# Patient Record
Sex: Male | Born: 1990 | Race: White | Hispanic: No | Marital: Single | Smoking: Current every day smoker
Health system: Southern US, Community
[De-identification: ages and names within clinical notes are randomized; demographics above are authoritative.]

## PROBLEM LIST (undated history)

## (undated) DIAGNOSIS — Z8781 Personal history of (healed) traumatic fracture: Secondary | ICD-10-CM

---

## 2011-09-27 DIAGNOSIS — Z8781 Personal history of (healed) traumatic fracture: Secondary | ICD-10-CM

## 2011-09-27 HISTORY — DX: Personal history of (healed) traumatic fracture: Z87.81

## 2011-12-31 ENCOUNTER — Emergency Department (HOSPITAL_COMMUNITY)
Admission: EM | Admit: 2011-12-31 | Discharge: 2011-12-31 | Disposition: A | Discharge status: Home / Self Care | Payer: Self-pay | Attending: Emergency Medicine | Admitting: Emergency Medicine

## 2011-12-31 ENCOUNTER — Emergency Department (HOSPITAL_COMMUNITY): Payer: Self-pay

## 2011-12-31 ENCOUNTER — Encounter (HOSPITAL_COMMUNITY): Payer: Self-pay

## 2011-12-31 DIAGNOSIS — S99921A Unspecified injury of right foot, initial encounter: Secondary | ICD-10-CM

## 2011-12-31 DIAGNOSIS — Z7982 Long term (current) use of aspirin: Secondary | ICD-10-CM | POA: Insufficient documentation

## 2011-12-31 DIAGNOSIS — W219XXA Striking against or struck by unspecified sports equipment, initial encounter: Secondary | ICD-10-CM | POA: Insufficient documentation

## 2011-12-31 DIAGNOSIS — S99929A Unspecified injury of unspecified foot, initial encounter: Secondary | ICD-10-CM | POA: Insufficient documentation

## 2011-12-31 DIAGNOSIS — S8990XA Unspecified injury of unspecified lower leg, initial encounter: Secondary | ICD-10-CM | POA: Insufficient documentation

## 2011-12-31 DIAGNOSIS — Y998 Other external cause status: Secondary | ICD-10-CM | POA: Insufficient documentation

## 2011-12-31 DIAGNOSIS — Y9361 Activity, american tackle football: Secondary | ICD-10-CM | POA: Insufficient documentation

## 2011-12-31 DIAGNOSIS — F17200 Nicotine dependence, unspecified, uncomplicated: Secondary | ICD-10-CM | POA: Insufficient documentation

## 2011-12-31 MED ORDER — OXYCODONE-ACETAMINOPHEN 5-325 MG PO TABS: 1.0000 | ORAL_TABLET | Freq: Four times a day (QID) | ORAL | Status: AC | PRN

## 2011-12-31 NOTE — ED Notes (Signed)
Pt in from home with pain and swelling to the right foot states onset yesterday while playing football states numbness and tingling to the toes capillary refill good pulse present states difficulty walking

## 2011-12-31 NOTE — ED Provider Notes (Signed)
History     CSN: [id number]  Arrival date & time 12/31/11  1136   First MD Initiated Contact with Patient 12/31/11 1246      Chief Complaint  Patient presents with  . Foot Pain    (Consider location/radiation/quality/duration/timing/severity/associated sxs/prior treatment) HPI  21 year old male presents with right foot injury. Patient states pain football yesterday when another player accidentally stomp on his right foot. Patient experiencing acute onset of pain and he may have heard a pop. Pain was significant enough that he is unable to bear any weight afterward. He noticed swelling and tingling sensation to his foot. He denies any ankle pain. He denies any other injury. He denies hitting his head or loss of consciousness.  History reviewed. No pertinent past medical history.  History reviewed. No pertinent past surgical history.  No family history on file.  History  Substance Use Topics  . Smoking status: Current Some Day Smoker  . Smokeless tobacco: Not on file  . Alcohol Use: No      Review of Systems  Constitutional: Negative.   HENT: Negative for neck pain.   Cardiovascular: Negative for chest pain.  Musculoskeletal: Negative for back pain.    Allergies  Review of patient's allergies indicates no known allergies.  Home Medications   Current Outpatient Rx  Name Route Sig Dispense Refill  . ASPIRIN 325 MG PO TABS Oral Take 650 mg by mouth every 6 (six) hours as needed. For pain.      BP 139/85  Pulse 80  Temp(Src) 98.1 F (36.7 C) (Oral)  Resp 20  SpO2 100%  Physical Exam  Nursing note and vitals reviewed. Constitutional: He appears well-developed and well-nourished. No distress.  HENT:  Head: Atraumatic.  Eyes: Conjunctivae are normal.  Neck: Neck supple.  Musculoskeletal: He exhibits edema and tenderness.       R foot: edema and tenderness noted to dorsum and sole of R foot distally.  Palpable pedal pulse, brisk cap refills, sensation intact.   No break in skin, no rash.  Ecchymosis noted  R ankle with FROM, nontender.  Pt unable to bear weight on R foot.    Neurological: He is alert.  Skin: Skin is warm.  Psychiatric: He has a normal mood and affect.    ED Course  Procedures (including critical care time)  Labs Reviewed - No data to display Dg Foot Complete Right  12/31/2011  *RADIOLOGY REPORT*  Clinical Data: Foot pain post injury yesterday  RIGHT FOOT COMPLETE - 3+ VIEW  Comparison: None.  Findings: Three views of the right foot submitted.  No acute fracture or subluxation.  No radiopaque foreign body.  IMPRESSION: No acute fracture or subluxation.  Original Report Authenticated By: Natasha Mead, M.D.     No diagnosis found.    MDM  R foot injury, xray negative for fx.  Will provide post-op shoe, crutches, pain medication and referral to ortho.  Pt voice understanding.          Fayrene Helper, PA-C 12/31/11 1300

## 2011-12-31 NOTE — Discharge Instructions (Signed)
You have injured your right foot.  No evidence of fracture or dislocation on xray.  Wear post-op shoe, use crutches, and take pain medication as prescribed.  Do not drive or operate heavy machinery while taking pain medication.  Follow up with orthopedic for further management.    RICE: Routine Care for Injuries The routine care of many injuries includes Rest, Ice, Compression, and Elevation (RICE). HOME CARE INSTRUCTIONS  Rest is needed to allow your body to heal. Routine activities can usually be resumed when comfortable. Injured tendons and bones can take up to 6 weeks to heal. Tendons are the cord-like structures that attach muscle to bone.   Ice following an injury helps keep the swelling down and reduces pain.   Put ice in a plastic bag.   Place a towel between your skin and the bag.   Leave the ice on for 15 to 20 minutes, 3 to 4 times a day. Do this while awake, for the first 24 to 48 hours. After that, continue as directed by your caregiver.   Compression helps keep swelling down. It also gives support and helps with discomfort. If an elastic bandage has been applied, it should be removed and reapplied every 3 to 4 hours. It should not be applied tightly, but firmly enough to keep swelling down. Watch fingers or toes for swelling, bluish discoloration, coldness, numbness, or excessive pain. If any of these problems occur, remove the bandage and reapply loosely. Contact your caregiver if these problems continue.   Elevation helps reduce swelling and decreases pain. With extremities, such as the arms, hands, legs, and feet, the injured area should be placed near or above the level of the heart, if possible.  SEEK IMMEDIATE MEDICAL CARE IF:  You have persistent pain and swelling.   You develop redness, numbness, or unexpected weakness.   Your symptoms are getting worse rather than improving after several days.  These symptoms may indicate that further evaluation or further X-rays are  needed. Sometimes, X-rays may not show a small broken bone (fracture) until 1 week or 10 days later. Make a follow-up appointment with your caregiver. Ask when your X-ray results will be ready. Make sure you get your X-ray results. Document Released: 12/25/2000 Document Revised: 09/01/2011 Document Reviewed: 02/11/2011 [facility] Patient Information 2012 [name], [name].

## 2012-01-01 NOTE — ED Provider Notes (Signed)
Medical screening examination/treatment/procedure(s) were performed by non-physician practitioner and as supervising physician I was immediately available for consultation/collaboration.   Cami Delawder Y. Jovon Streetman, MD 01/01/12 0726 

## 2012-02-16 ENCOUNTER — Encounter (HOSPITAL_COMMUNITY): Payer: Self-pay | Admitting: Certified Registered Nurse Anesthetist

## 2012-02-16 ENCOUNTER — Emergency Department (HOSPITAL_COMMUNITY): Payer: Self-pay

## 2012-02-16 ENCOUNTER — Encounter (HOSPITAL_COMMUNITY)
Admission: EM | Disposition: A | Discharge status: Home / Self Care | Payer: Self-pay | Source: Home / Self Care | Attending: Emergency Medicine

## 2012-02-16 ENCOUNTER — Encounter (HOSPITAL_COMMUNITY): Payer: Self-pay | Admitting: Emergency Medicine

## 2012-02-16 ENCOUNTER — Emergency Department (HOSPITAL_COMMUNITY)
Admission: EM | Admit: 2012-02-16 | Discharge: 2012-02-17 | Disposition: A | Discharge status: Home / Self Care | Payer: Self-pay | Attending: Emergency Medicine | Admitting: Emergency Medicine

## 2012-02-16 ENCOUNTER — Emergency Department (HOSPITAL_COMMUNITY): Payer: Self-pay | Admitting: Certified Registered Nurse Anesthetist

## 2012-02-16 DIAGNOSIS — IMO0002 Reserved for concepts with insufficient information to code with codable children: Secondary | ICD-10-CM | POA: Insufficient documentation

## 2012-02-16 DIAGNOSIS — S63004A Unspecified dislocation of right wrist and hand, initial encounter: Secondary | ICD-10-CM

## 2012-02-16 DIAGNOSIS — S62143A Displaced fracture of body of hamate [unciform] bone, unspecified wrist, initial encounter for closed fracture: Secondary | ICD-10-CM | POA: Insufficient documentation

## 2012-02-16 DIAGNOSIS — S62141A Displaced fracture of body of hamate [unciform] bone, right wrist, initial encounter for closed fracture: Secondary | ICD-10-CM

## 2012-02-16 HISTORY — PX: FASCIOTOMY: SHX132

## 2012-02-16 LAB — POCT I-STAT, CHEM 8
BUN: 4 mg/dL — ABNORMAL LOW (ref 6–23)
Calcium, Ion: 1.1 mmol/L — ABNORMAL LOW (ref 1.12–1.32)
Creatinine, Ser: 1 mg/dL (ref 0.50–1.35)
Hemoglobin: 18.4 g/dL — ABNORMAL HIGH (ref 13.0–17.0)
Sodium: 141 mEq/L (ref 135–145)
TCO2: 24 mmol/L (ref 0–100)

## 2012-02-16 LAB — DIFFERENTIAL
Basophils Absolute: 0 10*3/uL (ref 0.0–0.1)
Eosinophils Absolute: 0.1 10*3/uL (ref 0.0–0.7)
Eosinophils Relative: 0 % (ref 0–5)
Monocytes Absolute: 1.1 10*3/uL — ABNORMAL HIGH (ref 0.1–1.0)

## 2012-02-16 LAB — CBC
HCT: 50.9 % (ref 39.0–52.0)
MCH: 31.7 pg (ref 26.0–34.0)
MCV: 89.6 fL (ref 78.0–100.0)
Platelets: 260 10*3/uL (ref 150–400)
RDW: 13.1 % (ref 11.5–15.5)

## 2012-02-16 SURGERY — CLOSED REDUCTION, FINGER, WITH PERCUTANEOUS PINNING
Anesthesia: General | Site: Hand | Laterality: Right | Wound class: Contaminated

## 2012-02-16 MED ORDER — LIDOCAINE HCL (CARDIAC) 20 MG/ML IV SOLN
INTRAVENOUS | Status: DC | PRN
  Administered 2012-02-16: 100 mg via INTRAVENOUS

## 2012-02-16 MED ORDER — 0.9 % SODIUM CHLORIDE (POUR BTL) OPTIME
TOPICAL | Status: DC | PRN
  Administered 2012-02-16 – 2012-02-17 (×2): 500 mL

## 2012-02-16 MED ORDER — PROPOFOL 10 MG/ML IV EMUL
INTRAVENOUS | Status: DC | PRN
  Administered 2012-02-16: 200 mg via INTRAVENOUS

## 2012-02-16 MED ORDER — MIDAZOLAM HCL 5 MG/5ML IJ SOLN
INTRAMUSCULAR | Status: DC | PRN
  Administered 2012-02-16: 2 mg via INTRAVENOUS

## 2012-02-16 MED ORDER — BUPIVACAINE HCL (PF) 0.5 % IJ SOLN
INTRAMUSCULAR | Status: AC
  Filled 2012-02-16: qty 30

## 2012-02-16 MED ORDER — SUCCINYLCHOLINE CHLORIDE 20 MG/ML IJ SOLN
INTRAMUSCULAR | Status: DC | PRN
  Administered 2012-02-16: 100 mg via INTRAVENOUS

## 2012-02-16 MED ORDER — CHLORHEXIDINE GLUCONATE 4 % EX LIQD: 60.0000 mL | Freq: Once | CUTANEOUS | Status: AC

## 2012-02-16 MED ORDER — LACTATED RINGERS IV SOLN
INTRAVENOUS | Status: DC | PRN
  Administered 2012-02-16: 22:00:00 via INTRAVENOUS

## 2012-02-16 MED ORDER — ROCURONIUM BROMIDE 100 MG/10ML IV SOLN
INTRAVENOUS | Status: DC | PRN
  Administered 2012-02-16: 40 mg via INTRAVENOUS

## 2012-02-16 MED ORDER — DEXAMETHASONE SODIUM PHOSPHATE 10 MG/ML IJ SOLN
INTRAMUSCULAR | Status: DC | PRN
  Administered 2012-02-16: 10 mg via INTRAVENOUS

## 2012-02-16 MED ORDER — HYDROCODONE-ACETAMINOPHEN 5-325 MG PO TABS
1.0000 | ORAL_TABLET | Freq: Once | ORAL | Status: AC
  Administered 2012-02-16: 1 via ORAL
  Filled 2012-02-16: qty 1

## 2012-02-16 MED ORDER — TETANUS-DIPHTH-ACELL PERTUSSIS 5-2.5-18.5 LF-MCG/0.5 IM SUSP
0.5000 mL | Freq: Once | INTRAMUSCULAR | Status: AC
  Administered 2012-02-16: 0.5 mL via INTRAMUSCULAR
  Filled 2012-02-16: qty 0.5

## 2012-02-16 MED ORDER — ACETAMINOPHEN 10 MG/ML IV SOLN
INTRAVENOUS | Status: DC | PRN
  Administered 2012-02-16: 1000 mg via INTRAVENOUS

## 2012-02-16 MED ORDER — CEFAZOLIN SODIUM 1-5 GM-% IV SOLN
INTRAVENOUS | Status: AC
  Filled 2012-02-16: qty 50

## 2012-02-16 MED ORDER — NICOTINE 21 MG/24HR TD PT24
21.0000 mg | MEDICATED_PATCH | Freq: Once | TRANSDERMAL | Status: DC
  Administered 2012-02-16: 21 mg via TRANSDERMAL
  Filled 2012-02-16: qty 1

## 2012-02-16 MED ORDER — ACETAMINOPHEN 10 MG/ML IV SOLN
INTRAVENOUS | Status: AC
  Filled 2012-02-16: qty 100

## 2012-02-16 MED ORDER — CEFAZOLIN SODIUM 1-5 GM-% IV SOLN
1.0000 g | INTRAVENOUS | Status: AC
  Administered 2012-02-16 (×2): 1 g via INTRAVENOUS
  Filled 2012-02-16: qty 50

## 2012-02-16 MED ORDER — FENTANYL CITRATE 0.05 MG/ML IJ SOLN
INTRAMUSCULAR | Status: DC | PRN
  Administered 2012-02-16 – 2012-02-17 (×5): 50 ug via INTRAVENOUS

## 2012-02-16 SURGICAL SUPPLY — 41 items
BAG ZIPLOCK 12X15 (MISCELLANEOUS) ×3 IMPLANT
BANDAGE ELASTIC 3 VELCRO ST LF (GAUZE/BANDAGES/DRESSINGS) ×6 IMPLANT
BANDAGE ELASTIC 4 VELCRO ST LF (GAUZE/BANDAGES/DRESSINGS) IMPLANT
BANDAGE GAUZE ELAST BULKY 4 IN (GAUZE/BANDAGES/DRESSINGS) ×6 IMPLANT
BNDG COHESIVE 4X5 TAN STRL (GAUZE/BANDAGES/DRESSINGS) IMPLANT
CANISTER SUCTION 2500CC (MISCELLANEOUS) IMPLANT
CLEANER TIP ELECTROSURG 2X2 (MISCELLANEOUS) IMPLANT
CLOTH BEACON ORANGE TIMEOUT ST (SAFETY) ×3 IMPLANT
CORDS BIPOLAR (ELECTRODE) ×3 IMPLANT
CUFF TOURN SGL QUICK 18 (TOURNIQUET CUFF) IMPLANT
CUFF TOURN SGL QUICK 24 (TOURNIQUET CUFF) ×1
CUFF TRNQT CYL 24X4X40X1 (TOURNIQUET CUFF) ×2 IMPLANT
DRAIN PENROSE 18X1/2 LTX STRL (DRAIN) IMPLANT
DRAPE OEC MINIVIEW 54X84 (DRAPES) ×3 IMPLANT
DRSG PAD ABDOMINAL 8X10 ST (GAUZE/BANDAGES/DRESSINGS) IMPLANT
ELECT REM PT RETURN 9FT ADLT (ELECTROSURGICAL) ×3
ELECTRODE REM PT RTRN 9FT ADLT (ELECTROSURGICAL) ×2 IMPLANT
GAUZE PACKING IODOFORM 1/2 (PACKING) IMPLANT
GAUZE PACKING IODOFORM 1/4X5 (PACKING) IMPLANT
GAUZE XEROFORM 1X8 LF (GAUZE/BANDAGES/DRESSINGS) ×3 IMPLANT
GAUZE XEROFORM 5X9 LF (GAUZE/BANDAGES/DRESSINGS) IMPLANT
GLOVE SURG ORTHO 8.0 STRL STRW (GLOVE) ×3 IMPLANT
GOWN STRL REIN XL XLG (GOWN DISPOSABLE) ×3 IMPLANT
HANDPIECE INTERPULSE COAX TIP (DISPOSABLE)
KIT BASIN OR (CUSTOM PROCEDURE TRAY) ×3 IMPLANT
MANIFOLD NEPTUNE II (INSTRUMENTS) ×3 IMPLANT
NS IRRIG 1000ML POUR BTL (IV SOLUTION) ×3 IMPLANT
PACK LOWER EXTREMITY WL (CUSTOM PROCEDURE TRAY) ×3 IMPLANT
PAD CAST 4YDX4 CTTN HI CHSV (CAST SUPPLIES) ×2 IMPLANT
PADDING CAST ABS 3INX4YD NS (CAST SUPPLIES) ×1
PADDING CAST ABS COTTON 3X4 (CAST SUPPLIES) ×2 IMPLANT
PADDING CAST COTTON 4X4 STRL (CAST SUPPLIES) ×1
SET HNDPC FAN SPRY TIP SCT (DISPOSABLE) IMPLANT
SPLINT PLASTER EXTRA FAST 3X15 (CAST SUPPLIES) ×20
SPLINT PLASTER GYPS XFAST 3X15 (CAST SUPPLIES) ×40 IMPLANT
SPONGE GAUZE 4X4 12PLY (GAUZE/BANDAGES/DRESSINGS) ×6 IMPLANT
SUT ETHILON 4 0 PS 2 18 (SUTURE) ×6 IMPLANT
SUT SILK 4 0 PS 2 (SUTURE) IMPLANT
SYR 20CC LL (SYRINGE) IMPLANT
SYR CONTROL 10ML LL (SYRINGE) ×3 IMPLANT
k-wire (Pin) ×12 IMPLANT

## 2012-02-16 NOTE — ED Notes (Signed)
Pt complains of right hand swelling after punching shed at 1200.

## 2012-02-16 NOTE — Anesthesia Preprocedure Evaluation (Signed)
Anesthesia Evaluation  Patient identified by MRN, date of birth, ID band Patient awake  General Assessment Comment:NPO today  Reviewed: Allergy & Precautions, H&P , NPO status , Patient's Chart, lab work & pertinent test results, reviewed documented beta blocker date and time   Airway Mallampati: II TM Distance: >3 FB Neck ROM: Full    Dental  (+) Teeth Intact and Dental Advisory Given   Pulmonary Current Smoker,  breath sounds clear to auscultation        Cardiovascular negative cardio ROS  Rhythm:Regular Rate:Normal     Neuro/Psych negative neurological ROS  negative psych ROS   GI/Hepatic negative GI ROS, Neg liver ROS,   Endo/Other  negative endocrine ROS  Renal/GU negative Renal ROS  negative genitourinary   Musculoskeletal negative musculoskeletal ROS (+)   Abdominal   Peds negative pediatric ROS (+)  Hematology negative hematology ROS (+)   Anesthesia Other Findings   Reproductive/Obstetrics negative OB ROS                           Anesthesia Physical Anesthesia Plan  ASA: II and Emergent  Anesthesia Plan: General   Post-op Pain Management:    Induction: Intravenous, Rapid sequence and Cricoid pressure planned  Airway Management Planned: Oral ETT  Additional Equipment:   Intra-op Plan:   Post-operative Plan: Extubation in OR  Informed Consent: I have reviewed the patients History and Physical, chart, labs and discussed the procedure including the risks, benefits and alternatives for the proposed anesthesia with the patient or authorized representative who has indicated his/her understanding and acceptance.   Dental advisory given  Plan Discussed with: CRNA and Surgeon  Anesthesia Plan Comments:         Anesthesia Quick Evaluation

## 2012-02-16 NOTE — ED Provider Notes (Signed)
Medical screening examination/treatment/procedure(s) were performed by non-physician practitioner and as supervising physician I was immediately available for consultation/collaboration.  We discussed the case at length, and reviewed XR together.  The patient was evaluated by our hand specialist on call. Merlyn Lot).  Gerhard Munch, MD 02/16/12 [phone]

## 2012-02-16 NOTE — H&P (Signed)
Logan Boyle is an 21 y.o. male.   Chief Complaint: right hand injury HPI: 21 yo rhd male states he punched a barn at approximately noon today.  Pain and swelling in the hand.  Reports no previous injuries and no other injuries at this time.  NPO > 6 hours.  History reviewed. No pertinent past medical history.  History reviewed. No pertinent past surgical history.  No family history on file. Social History:  reports that he has been smoking.  He does not have any smokeless tobacco history on file. He reports that he does not drink alcohol or use illicit drugs.  Allergies: No Known Allergies   (Not in a hospital admission)  Results for orders placed during the hospital encounter of 02/16/12 (from the past 48 hour(s))  CBC     Status: Abnormal   Collection Time   02/16/12  6:50 PM      Component Value Range Comment   WBC 14.4 (*) 4.0 - 10.5 (K/uL)    RBC 5.68  4.22 - 5.81 (MIL/uL)    Hemoglobin 18.0 (*) 13.0 - 17.0 (g/dL)    HCT 16.1  09.6 - 04.5 (%)    MCV 89.6  78.0 - 100.0 (fL)    MCH 31.7  26.0 - 34.0 (pg)    MCHC 35.4  30.0 - 36.0 (g/dL)    RDW 40.9  81.1 - 91.4 (%)    Platelets 260  150 - 400 (K/uL)   DIFFERENTIAL     Status: Abnormal   Collection Time   02/16/12  6:50 PM      Component Value Range Comment   Neutrophils Relative 73  43 - 77 (%)    Neutro Abs 10.5 (*) 1.7 - 7.7 (K/uL)    Lymphocytes Relative 18  12 - 46 (%)    Lymphs Abs 2.6  0.7 - 4.0 (K/uL)    Monocytes Relative 8  3 - 12 (%)    Monocytes Absolute 1.1 (*) 0.1 - 1.0 (K/uL)    Eosinophils Relative 0  0 - 5 (%)    Eosinophils Absolute 0.1  0.0 - 0.7 (K/uL)    Basophils Relative 0  0 - 1 (%)    Basophils Absolute 0.0  0.0 - 0.1 (K/uL)   POCT I-STAT, CHEM 8     Status: Abnormal   Collection Time   02/16/12  6:54 PM      Component Value Range Comment   Sodium 141  135 - 145 (mEq/L)    Potassium 4.0  3.5 - 5.1 (mEq/L)    Chloride 106  96 - 112 (mEq/L)    BUN 4 (*) 6 - 23 (mg/dL)    Creatinine, Ser 7.82   0.50 - 1.35 (mg/dL)    Glucose, Bld 98  70 - 99 (mg/dL)    Calcium, Ion 9.56 (*) 1.12 - 1.32 (mmol/L)    TCO2 24  0 - 100 (mmol/L)    Hemoglobin 18.4 (*) 13.0 - 17.0 (g/dL)    HCT 21.3 (*) 08.6 - 52.0 (%)     Ct Wrist Right Wo Contrast  02/16/2012  *RADIOLOGY REPORT*  Clinical Data: Fracture, soft tissue swelling.  CT OF THE RIGHT WRIST WITHOUT CONTRAST  Technique:  Multidetector CT imaging was performed according to the standard protocol. Multiplanar CT image reconstructions were also generated.  Comparison: 02/16/2012 plain films.  Findings: There is a fracture through the posterior aspect of the hamate.  The base of the fourth and fifth metacarpals are dislocated posteriorly.  Small avulsed fragments off the base of the metacarpals.  The second and third metacarpals are subluxed posteriorly.  Probable small avulsed fragment off the base of the second metacarpal.  IMPRESSION: Fractures through the posterior aspect of the right hamate.  Small avulsed fragments off the bases of the fourth, fifth and probable second metacarpals.  Fourth and fifth metacarpals are dislocated posteriorly.  Second and third metacarpals are subluxed posteriorly.  Original Report Authenticated By: Cyndie Chime, M.D.   Dg Hand Complete Right  02/16/2012  *RADIOLOGY REPORT*  Clinical Data: Right hand pain and swelling.  RIGHT HAND - COMPLETE 3+ VIEW  Comparison: None.  Findings: There is marked soft tissue swelling along the dorsal aspect of the hand.  The hamate is fractured.  Dorsal displacement of the second through fifth metacarpals is seen with respect to the carpus.  Difficult to exclude additional pleural fractures.  IMPRESSION: Carpal-metacarpal dislocation with a hamate fracture and severe dorsal soft tissue swelling.  Difficult to exclude an additional occult fracture.  CT wrist without contrast is recommended, as clinically indicated.  Original Report Authenticated By: Reyes Ivan, M.D.     A comprehensive  review of systems was negative.  Blood pressure 143/84, pulse 95, temperature 98.4 F (36.9 C), temperature source Oral, SpO2 98.00%.  General appearance: alert, cooperative and appears stated age Head: Normocephalic, without obvious abnormality, atraumatic Neck: supple, symmetrical, trachea midline Resp: clear to auscultation bilaterally Cardio: regular rate and rhythm GI: soft, non-tender; bowel sounds normal; no masses,  no organomegaly Extremities: light touch sensation and capillary refill intact all digits except right small which has decreased sensation and normal capillary refill.  +epl/fpl/io.  left hand with no wounds or ttp.  right hand with abrasions and ecchymosis over small and ring finger mp joints.  ttp small, ring, long finger cmc joints and some tenderness at index cmc.  swelling on dorsum of hand.  no tenderness in phalanges.  no tenderness in thumb or anatomic snuffbox. Pulses: 2+ and symmetric Skin: as above Neurologic: Grossly normal Incision/Wound: As above  Assessment/Plan Right small and ring cmc fracture dislocations and index and long cmc subluxation.  Recommend OR for crpp vs orif.  Risks, benefits, and alternatives of surgery were discussed and the patient agrees with the plan of care.   Clifford Coudriet R 02/16/2012, 7:46 PM

## 2012-02-16 NOTE — ED Provider Notes (Signed)
History     CSN: [id number]  Arrival date & time 02/16/12  1534   First MD Initiated Contact with Patient 02/16/12 [phone]      Chief Complaint  Patient presents with  . Hand Injury    rt hand    (Consider location/radiation/quality/duration/timing/severity/associated sxs/prior treatment) HPI Comments: Patient here with right hand pain after punching the side of a wooden barn - states no prior history of injury to the hand.  Reports pain and swelling to entire hand but numbness to right 5th finger.  Flexion and extension intact  Patient is a 21 y.o. male presenting with hand injury. The history is provided by the patient. No language interpreter was used.  Hand Injury  The incident occurred 3 to 5 hours ago. The incident occurred at home. The injury mechanism was a direct blow. The pain is present in the right hand. The quality of the pain is described as throbbing. The pain is at a severity of 9/10. The pain is severe. The pain has been constant since the incident. Pertinent negatives include no fever and no malaise/fatigue. He reports no foreign bodies present. The symptoms are aggravated by movement and palpation. He has tried nothing for the symptoms. The treatment provided no relief.    History reviewed. No pertinent past medical history.  History reviewed. No pertinent past surgical history.  No family history on file.  History  Substance Use Topics  . Smoking status: Current Some Day Smoker -- 1.0 packs/day  . Smokeless tobacco: Not on file  . Alcohol Use: No      Review of Systems  Constitutional: Negative for fever and malaise/fatigue.  Musculoskeletal: Positive for myalgias, joint swelling and arthralgias.  All other systems reviewed and are negative.    Allergies  Review of patient's allergies indicates no known allergies.  Home Medications  No current outpatient prescriptions on file.  BP 143/84  Pulse 95  Temp(Src) 98.4 F (36.9 C) (Oral)  SpO2  98%  Physical Exam  Nursing note and vitals reviewed. Constitutional: He is oriented to person, place, and time. He appears well-developed and well-nourished. No distress.  HENT:  Head: Normocephalic and atraumatic.  Right Ear: External ear normal.  Left Ear: External ear normal.  Nose: Nose normal.  Mouth/Throat: Oropharynx is clear and moist. No oropharyngeal exudate.  Eyes: Conjunctivae are normal. Pupils are equal, round, and reactive to light. No scleral icterus.  Neck: Normal range of motion. Neck supple.  Cardiovascular: Normal rate, regular rhythm and normal heart sounds.  Exam reveals no gallop and no friction rub.   No murmur heard. Pulmonary/Chest: Effort normal and breath sounds normal. No respiratory distress. He has no wheezes. He has no rales. He exhibits no tenderness.  Abdominal: Soft. Bowel sounds are normal. He exhibits no distension. There is no tenderness.  Musculoskeletal:       Right hand: He exhibits tenderness, bony tenderness, deformity and swelling. He exhibits normal range of motion and normal capillary refill. decreased sensation noted. Decreased sensation is present in the ulnar distribution. Normal strength noted. He exhibits no finger abduction, no thumb/finger opposition and no wrist extension trouble.       Marked swelling to the dorsum of right hand with small abrasions noted at the MCP joint of 4th and 5th fingers  Lymphadenopathy:    He has no cervical adenopathy.  Neurological: He is alert and oriented to person, place, and time. No cranial nerve deficit. He exhibits normal muscle tone. Coordination normal.  Skin:  Skin is warm and dry. No rash noted. No erythema. No pallor.  Psychiatric: He has a normal mood and affect. His behavior is normal. Judgment and thought content normal.    ED Course  Procedures (including critical care time)  Labs Reviewed - No data to display No results found.  Results for orders placed during the hospital encounter of  02/16/12  CBC      Component Value Range   WBC 14.4 (*) 4.0 - 10.5 (K/uL)   RBC 5.68  4.22 - 5.81 (MIL/uL)   Hemoglobin 18.0 (*) 13.0 - 17.0 (g/dL)   HCT 45.4  09.8 - 11.9 (%)   MCV 89.6  78.0 - 100.0 (fL)   MCH 31.7  26.0 - 34.0 (pg)   MCHC 35.4  30.0 - 36.0 (g/dL)   RDW 14.7  82.9 - 56.2 (%)   Platelets 260  150 - 400 (K/uL)  DIFFERENTIAL      Component Value Range   Neutrophils Relative 73  43 - 77 (%)   Neutro Abs 10.5 (*) 1.7 - 7.7 (K/uL)   Lymphocytes Relative 18  12 - 46 (%)   Lymphs Abs 2.6  0.7 - 4.0 (K/uL)   Monocytes Relative 8  3 - 12 (%)   Monocytes Absolute 1.1 (*) 0.1 - 1.0 (K/uL)   Eosinophils Relative 0  0 - 5 (%)   Eosinophils Absolute 0.1  0.0 - 0.7 (K/uL)   Basophils Relative 0  0 - 1 (%)   Basophils Absolute 0.0  0.0 - 0.1 (K/uL)  POCT I-STAT, CHEM 8      Component Value Range   Sodium 141  135 - 145 (mEq/L)   Potassium 4.0  3.5 - 5.1 (mEq/L)   Chloride 106  96 - 112 (mEq/L)   BUN 4 (*) 6 - 23 (mg/dL)   Creatinine, Ser 1.30  0.50 - 1.35 (mg/dL)   Glucose, Bld 98  70 - 99 (mg/dL)   Calcium, Ion 8.65 (*) 1.12 - 1.32 (mmol/L)   TCO2 24  0 - 100 (mmol/L)   Hemoglobin 18.4 (*) 13.0 - 17.0 (g/dL)   HCT 78.4 (*) 69.6 - 52.0 (%)   Ct Wrist Right Wo Contrast  02/16/2012  *RADIOLOGY REPORT*  Clinical Data: Fracture, soft tissue swelling.  CT OF THE RIGHT WRIST WITHOUT CONTRAST  Technique:  Multidetector CT imaging was performed according to the standard protocol. Multiplanar CT image reconstructions were also generated.  Comparison: 02/16/2012 plain films.  Findings: There is a fracture through the posterior aspect of the hamate.  The base of the fourth and fifth metacarpals are dislocated posteriorly.  Small avulsed fragments off the base of the metacarpals.  The second and third metacarpals are subluxed posteriorly.  Probable small avulsed fragment off the base of the second metacarpal.  IMPRESSION: Fractures through the posterior aspect of the right hamate.  Small  avulsed fragments off the bases of the fourth, fifth and probable second metacarpals.  Fourth and fifth metacarpals are dislocated posteriorly.  Second and third metacarpals are subluxed posteriorly.  Original Report Authenticated By: Cyndie Chime, M.D.   Dg Hand Complete Right  02/16/2012  *RADIOLOGY REPORT*  Clinical Data: Right hand pain and swelling.  RIGHT HAND - COMPLETE 3+ VIEW  Comparison: None.  Findings: There is marked soft tissue swelling along the dorsal aspect of the hand.  The hamate is fractured.  Dorsal displacement of the second through fifth metacarpals is seen with respect to the carpus.  Difficult to exclude  additional pleural fractures.  IMPRESSION: Carpal-metacarpal dislocation with a hamate fracture and severe dorsal soft tissue swelling.  Difficult to exclude an additional occult fracture.  CT wrist without contrast is recommended, as clinically indicated.  Original Report Authenticated By: Reyes Ivan, M.D.     Right Hamate fracture Right wrist dislocation    MDM  Patient here after punching a barn wall - marked soft tissue swelling noted to the dorsum of hand, deformity of the wrist - spoke with Dr. Merlyn Lot who will come in to take the patient to the OR for repair of this.        Izola Price [place], [place] 02/16/12 1925

## 2012-02-16 NOTE — ED Notes (Signed)
Pt ambulatory WNL, accompanied to OR by OR tech.

## 2012-02-16 NOTE — ED Notes (Signed)
Pt hit wood wall with rt hand. Rt hand swollen. 2+

## 2012-02-17 MED ORDER — OXYCODONE-ACETAMINOPHEN 5-325 MG PO TABS
ORAL_TABLET | ORAL | Status: AC
  Filled 2012-02-17: qty 2

## 2012-02-17 MED ORDER — ONDANSETRON HCL 4 MG/2ML IJ SOLN
INTRAMUSCULAR | Status: DC | PRN
  Administered 2012-02-17: 4 mg via INTRAVENOUS

## 2012-02-17 MED ORDER — OXYCODONE-ACETAMINOPHEN 5-325 MG PO TABS: ORAL_TABLET | ORAL | Status: AC

## 2012-02-17 MED ORDER — NEOSTIGMINE METHYLSULFATE 1 MG/ML IJ SOLN
INTRAMUSCULAR | Status: DC | PRN
  Administered 2012-02-17: 5 mg via INTRAVENOUS

## 2012-02-17 MED ORDER — OXYCODONE-ACETAMINOPHEN 5-325 MG PO TABS
2.0000 | ORAL_TABLET | Freq: Once | ORAL | Status: AC
  Administered 2012-02-17: 2 via ORAL

## 2012-02-17 MED ORDER — HYDROMORPHONE HCL PF 1 MG/ML IJ SOLN: 0.2500 mg | INTRAMUSCULAR | Status: DC | PRN

## 2012-02-17 MED ORDER — LACTATED RINGERS IV SOLN: INTRAVENOUS | Status: DC

## 2012-02-17 MED ORDER — GLYCOPYRROLATE 0.2 MG/ML IJ SOLN
INTRAMUSCULAR | Status: DC | PRN
  Administered 2012-02-17: .8 mg via INTRAVENOUS

## 2012-02-17 MED ORDER — BUPIVACAINE HCL (PF) 0.5 % IJ SOLN
INTRAMUSCULAR | Status: DC | PRN
  Administered 2012-02-17: 10 mL

## 2012-02-17 NOTE — Anesthesia Postprocedure Evaluation (Signed)
  Anesthesia Post-op Note  Patient: Logan Boyle  Procedure(s) Performed: * No procedures listed *  Patient Location: PACU  Anesthesia Type: General  Level of Consciousness: oriented and sedated  Airway and Oxygen Therapy: Patient Spontanous Breathing  Post-op Pain: mild  Post-op Assessment: Post-op Vital signs reviewed, Patient's Cardiovascular Status Stable, Respiratory Function Stable and Patent Airway  Post-op Vital Signs: stable  Complications: No apparent anesthesia complications

## 2012-02-17 NOTE — Transfer of Care (Signed)
Immediate Anesthesia Transfer of Care Note  Patient: Logan Boyle  Procedure(s) Performed: Procedure(s) (LRB): CLOSED REDUCTION FINGER WITH PERCUTANEOUS PINNING (Right) FASCIOTOMY (Right)  Patient Location: PACU  Anesthesia Type: General  Level of Consciousness: sedated, patient cooperative and responds to stimulaton  Airway & Oxygen Therapy: Patient Spontanous Breathing and Patient connected to face mask oxgen  Post-op Assessment: Report given to PACU RN and Post -op Vital signs reviewed and stable  Post vital signs: Reviewed and stable  Complications: No apparent anesthesia complications

## 2012-02-17 NOTE — Op Note (Signed)
Dictation [id number]

## 2012-02-17 NOTE — Discharge Instructions (Signed)
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
## 2012-02-22 ENCOUNTER — Encounter (HOSPITAL_COMMUNITY): Payer: Self-pay | Admitting: Orthopedic Surgery

## 2012-03-05 NOTE — Anesthesia Postprocedure Evaluation (Signed)
  Anesthesia Post-op Note  Patient: Logan Boyle  Procedure(s) Performed: Procedure(s) (LRB): CLOSED REDUCTION FINGER WITH PERCUTANEOUS PINNING (Right) FASCIOTOMY (Right)  Patient Location: PACU  Anesthesia Type: General  Level of Consciousness: oriented and sedated  Airway and Oxygen Therapy: Patient Spontanous Breathing and Patient connected to nasal cannula oxygen  Post-op Pain: mild  Post-op Assessment: Post-op Vital signs reviewed, Patient's Cardiovascular Status Stable, Respiratory Function Stable and Patent Airway  Post-op Vital Signs: stable  Complications: No apparent anesthesia complications

## 2012-08-28 ENCOUNTER — Emergency Department (HOSPITAL_COMMUNITY): Payer: Self-pay

## 2012-08-28 ENCOUNTER — Encounter (HOSPITAL_COMMUNITY): Payer: Self-pay | Admitting: Adult Health

## 2012-08-28 ENCOUNTER — Emergency Department (HOSPITAL_COMMUNITY)
Admission: EM | Admit: 2012-08-28 | Discharge: 2012-08-28 | Disposition: A | Discharge status: Home / Self Care | Payer: Self-pay | Attending: Emergency Medicine | Admitting: Emergency Medicine

## 2012-08-28 DIAGNOSIS — F17200 Nicotine dependence, unspecified, uncomplicated: Secondary | ICD-10-CM | POA: Insufficient documentation

## 2012-08-28 DIAGNOSIS — S62306A Unspecified fracture of fifth metacarpal bone, right hand, initial encounter for closed fracture: Secondary | ICD-10-CM

## 2012-08-28 DIAGNOSIS — S62309A Unspecified fracture of unspecified metacarpal bone, initial encounter for closed fracture: Secondary | ICD-10-CM | POA: Insufficient documentation

## 2012-08-28 DIAGNOSIS — W2209XA Striking against other stationary object, initial encounter: Secondary | ICD-10-CM | POA: Insufficient documentation

## 2012-08-28 DIAGNOSIS — Y9389 Activity, other specified: Secondary | ICD-10-CM | POA: Insufficient documentation

## 2012-08-28 DIAGNOSIS — Y929 Unspecified place or not applicable: Secondary | ICD-10-CM | POA: Insufficient documentation

## 2012-08-28 DIAGNOSIS — R209 Unspecified disturbances of skin sensation: Secondary | ICD-10-CM | POA: Insufficient documentation

## 2012-08-28 MED ORDER — IBUPROFEN 800 MG PO TABS: 800.0000 mg | ORAL_TABLET | Freq: Three times a day (TID) | ORAL | Status: DC

## 2012-08-28 NOTE — Progress Notes (Signed)
Orthopedic Tech Progress Note Patient Details:  Logan Boyle 11/16/1990 [id number]  Ortho Devices Type of Ortho Device: Ulna gutter splint;Ace wrap Ortho Device/Splint Location: right hand Ortho Device/Splint Interventions: Application   [name] 08/28/2012, 11:00 PM
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
## 2012-08-28 NOTE — ED Notes (Signed)
Ortho paged for splint 

## 2012-08-28 NOTE — ED Notes (Signed)
Pt rates pain to right hand 8/10. Right hand is swollen and pt unable to make a fist. Skin is dry/intact. A&Ox4, ambulatory, nad.

## 2012-08-29 ENCOUNTER — Other Ambulatory Visit: Payer: Self-pay | Admitting: Orthopedic Surgery

## 2012-08-30 ENCOUNTER — Ambulatory Visit (HOSPITAL_BASED_OUTPATIENT_CLINIC_OR_DEPARTMENT_OTHER)
Admission: RE | Admit: 2012-08-30 | Discharge: 2012-08-30 | Disposition: A | Discharge status: Home / Self Care | Payer: Self-pay | Source: Ambulatory Visit | Attending: Orthopedic Surgery | Admitting: Orthopedic Surgery

## 2012-08-30 ENCOUNTER — Encounter (HOSPITAL_BASED_OUTPATIENT_CLINIC_OR_DEPARTMENT_OTHER): Payer: Self-pay | Admitting: *Deleted

## 2012-08-30 ENCOUNTER — Ambulatory Visit (HOSPITAL_BASED_OUTPATIENT_CLINIC_OR_DEPARTMENT_OTHER): Payer: Self-pay | Admitting: Certified Registered Nurse Anesthetist

## 2012-08-30 ENCOUNTER — Encounter (HOSPITAL_BASED_OUTPATIENT_CLINIC_OR_DEPARTMENT_OTHER): Payer: Self-pay | Admitting: Certified Registered Nurse Anesthetist

## 2012-08-30 ENCOUNTER — Encounter (HOSPITAL_BASED_OUTPATIENT_CLINIC_OR_DEPARTMENT_OTHER)
Admission: RE | Disposition: A | Discharge status: Home / Self Care | Payer: Self-pay | Source: Ambulatory Visit | Attending: Orthopedic Surgery

## 2012-08-30 ENCOUNTER — Encounter (HOSPITAL_BASED_OUTPATIENT_CLINIC_OR_DEPARTMENT_OTHER): Payer: Self-pay | Admitting: Anesthesiology

## 2012-08-30 DIAGNOSIS — W2203XA Walked into furniture, initial encounter: Secondary | ICD-10-CM | POA: Insufficient documentation

## 2012-08-30 DIAGNOSIS — S62319A Displaced fracture of base of unspecified metacarpal bone, initial encounter for closed fracture: Secondary | ICD-10-CM | POA: Insufficient documentation

## 2012-08-30 HISTORY — PX: CLOSED REDUCTION METACARPAL WITH PERCUTANEOUS PINNING: SHX5613

## 2012-08-30 SURGERY — CLOSED REDUCTION, FRACTURE, METACARPAL BONE, WITH PERCUTANEOUS PINNING
Anesthesia: General | Site: Hand | Laterality: Right | Wound class: Clean

## 2012-08-30 MED ORDER — LACTATED RINGERS IV SOLN
INTRAVENOUS | Status: DC
  Administered 2012-08-30 (×2): via INTRAVENOUS

## 2012-08-30 MED ORDER — OXYCODONE HCL 5 MG/5ML PO SOLN: 5.0000 mg | Freq: Once | ORAL | Status: DC | PRN

## 2012-08-30 MED ORDER — OXYCODONE-ACETAMINOPHEN 5-325 MG PO TABS: ORAL_TABLET | ORAL | Status: DC

## 2012-08-30 MED ORDER — CEFAZOLIN SODIUM-DEXTROSE 2-3 GM-% IV SOLR
2.0000 g | Freq: Once | INTRAVENOUS | Status: AC
  Administered 2012-08-30: 2 g via INTRAVENOUS

## 2012-08-30 MED ORDER — ONDANSETRON HCL 4 MG/2ML IJ SOLN
INTRAMUSCULAR | Status: DC | PRN
  Administered 2012-08-30: 4 mg via INTRAVENOUS

## 2012-08-30 MED ORDER — FENTANYL CITRATE 0.05 MG/ML IJ SOLN
INTRAMUSCULAR | Status: DC | PRN
  Administered 2012-08-30 (×2): 100 ug via INTRAVENOUS

## 2012-08-30 MED ORDER — KETOROLAC TROMETHAMINE 30 MG/ML IJ SOLN
INTRAMUSCULAR | Status: DC | PRN
  Administered 2012-08-30: 30 mg via INTRAVENOUS

## 2012-08-30 MED ORDER — DEXAMETHASONE SODIUM PHOSPHATE 10 MG/ML IJ SOLN
INTRAMUSCULAR | Status: DC | PRN
  Administered 2012-08-30: 10 mg via INTRAVENOUS

## 2012-08-30 MED ORDER — OXYCODONE HCL 5 MG PO TABS: 5.0000 mg | ORAL_TABLET | Freq: Once | ORAL | Status: DC | PRN

## 2012-08-30 MED ORDER — MIDAZOLAM HCL 5 MG/5ML IJ SOLN
INTRAMUSCULAR | Status: DC | PRN
  Administered 2012-08-30: 2 mg via INTRAVENOUS

## 2012-08-30 MED ORDER — HYDROMORPHONE HCL PF 1 MG/ML IJ SOLN
0.2500 mg | INTRAMUSCULAR | Status: DC | PRN
  Administered 2012-08-30: 0.5 mg via INTRAVENOUS

## 2012-08-30 MED ORDER — PROPOFOL 10 MG/ML IV BOLUS
INTRAVENOUS | Status: DC | PRN
  Administered 2012-08-30: 200 mg via INTRAVENOUS

## 2012-08-30 MED ORDER — BUPIVACAINE HCL (PF) 0.25 % IJ SOLN
INTRAMUSCULAR | Status: DC | PRN
  Administered 2012-08-30: 7 mL

## 2012-08-30 MED ORDER — CHLORHEXIDINE GLUCONATE 4 % EX LIQD
60.0000 mL | Freq: Once | CUTANEOUS | Status: DC
Start: 2012-08-30 — End: 2012-08-30

## 2012-08-30 MED ORDER — LIDOCAINE HCL (CARDIAC) 20 MG/ML IV SOLN
INTRAVENOUS | Status: DC | PRN
  Administered 2012-08-30: 40 mg via INTRAVENOUS

## 2012-08-30 SURGICAL SUPPLY — 55 items
BANDAGE ELASTIC 3 VELCRO ST LF (GAUZE/BANDAGES/DRESSINGS) ×2 IMPLANT
BANDAGE GAUZE ELAST BULKY 4 IN (GAUZE/BANDAGES/DRESSINGS) ×2 IMPLANT
BLADE MINI RND TIP GREEN BEAV (BLADE) IMPLANT
BLADE SURG 15 STRL LF DISP TIS (BLADE) ×2 IMPLANT
BLADE SURG 15 STRL SS (BLADE) ×2
BNDG ELASTIC 2 VLCR STRL LF (GAUZE/BANDAGES/DRESSINGS) IMPLANT
BNDG ESMARK 4X9 LF (GAUZE/BANDAGES/DRESSINGS) ×2 IMPLANT
CHLORAPREP W/TINT 26ML (MISCELLANEOUS) ×2 IMPLANT
CLOTH BEACON ORANGE TIMEOUT ST (SAFETY) ×2 IMPLANT
CORDS BIPOLAR (ELECTRODE) IMPLANT
COVER MAYO STAND STRL (DRAPES) ×2 IMPLANT
COVER TABLE BACK 60X90 (DRAPES) ×2 IMPLANT
CUFF TOURNIQUET SINGLE 18IN (TOURNIQUET CUFF) ×2 IMPLANT
DRAPE EXTREMITY T 121X128X90 (DRAPE) ×2 IMPLANT
DRAPE OEC MINIVIEW 54X84 (DRAPES) ×2 IMPLANT
DRAPE SURG 17X23 STRL (DRAPES) ×2 IMPLANT
GAUZE XEROFORM 1X8 LF (GAUZE/BANDAGES/DRESSINGS) ×2 IMPLANT
GLOVE BIO SURGEON STRL SZ7.5 (GLOVE) ×2 IMPLANT
GLOVE BIOGEL PI IND STRL 8 (GLOVE) ×1 IMPLANT
GLOVE BIOGEL PI IND STRL 8.5 (GLOVE) IMPLANT
GLOVE BIOGEL PI INDICATOR 8 (GLOVE) ×1
GLOVE BIOGEL PI INDICATOR 8.5 (GLOVE)
GLOVE SURG ORTHO 8.0 STRL STRW (GLOVE) IMPLANT
GOWN PREVENTION PLUS XLARGE (GOWN DISPOSABLE) IMPLANT
GOWN PREVENTION PLUS XXLARGE (GOWN DISPOSABLE) ×2 IMPLANT
GOWN STRL REIN XL XLG (GOWN DISPOSABLE) ×2 IMPLANT
KWIRE 4.0 X .045IN (WIRE) ×6 IMPLANT
NEEDLE HYPO 22GX1.5 SAFETY (NEEDLE) IMPLANT
NEEDLE HYPO 25X1 1.5 SAFETY (NEEDLE) ×2 IMPLANT
NS IRRIG 1000ML POUR BTL (IV SOLUTION) ×2 IMPLANT
PACK BASIN DAY SURGERY FS (CUSTOM PROCEDURE TRAY) ×2 IMPLANT
PAD CAST 3X4 CTTN HI CHSV (CAST SUPPLIES) ×1 IMPLANT
PAD CAST 4YDX4 CTTN HI CHSV (CAST SUPPLIES) IMPLANT
PADDING CAST ABS 4INX4YD NS (CAST SUPPLIES) ×1
PADDING CAST ABS COTTON 4X4 ST (CAST SUPPLIES) ×1 IMPLANT
PADDING CAST COTTON 3X4 STRL (CAST SUPPLIES) ×1
PADDING CAST COTTON 4X4 STRL (CAST SUPPLIES)
SLEEVE SCD COMPRESS KNEE MED (MISCELLANEOUS) IMPLANT
SPLINT PLASTER CAST XFAST 3X15 (CAST SUPPLIES) IMPLANT
SPLINT PLASTER CAST XFAST 4X15 (CAST SUPPLIES) IMPLANT
SPLINT PLASTER XTRA FAST SET 4 (CAST SUPPLIES)
SPLINT PLASTER XTRA FASTSET 3X (CAST SUPPLIES)
SPONGE GAUZE 4X4 12PLY (GAUZE/BANDAGES/DRESSINGS) ×2 IMPLANT
STOCKINETTE 4X48 STRL (DRAPES) ×2 IMPLANT
SUT ETHILON 3 0 PS 1 (SUTURE) IMPLANT
SUT ETHILON 4 0 PS 2 18 (SUTURE) ×2 IMPLANT
SUT MERSILENE 4 0 P 3 (SUTURE) IMPLANT
SUT VIC AB 3-0 PS1 18 (SUTURE)
SUT VIC AB 3-0 PS1 18XBRD (SUTURE) IMPLANT
SUT VICRYL 4-0 PS2 18IN ABS (SUTURE) IMPLANT
SYR BULB 3OZ (MISCELLANEOUS) ×2 IMPLANT
SYR CONTROL 10ML LL (SYRINGE) ×2 IMPLANT
TOWEL OR 17X24 6PK STRL BLUE (TOWEL DISPOSABLE) ×2 IMPLANT
UNDERPAD 30X30 INCONTINENT (UNDERPADS AND DIAPERS) ×2 IMPLANT
WATER STERILE IRR 1000ML POUR (IV SOLUTION) IMPLANT

## 2012-08-30 NOTE — Anesthesia Preprocedure Evaluation (Signed)
Anesthesia Evaluation  Patient identified by MRN, date of birth, ID band Patient awake    Reviewed: Allergy & Precautions, H&P , NPO status , Patient's Chart, lab work & pertinent test results  Airway Mallampati: II TM Distance: >3 FB Neck ROM: Full    Dental No notable dental hx. (+) Teeth Intact and Dental Advisory Given   Pulmonary neg pulmonary ROS,  breath sounds clear to auscultation  Pulmonary exam normal       Cardiovascular negative cardio ROS  Rhythm:Regular Rate:Normal     Neuro/Psych negative neurological ROS  negative psych ROS   GI/Hepatic negative GI ROS, Neg liver ROS,   Endo/Other  negative endocrine ROS  Renal/GU negative Renal ROS  negative genitourinary   Musculoskeletal   Abdominal   Peds  Hematology negative hematology ROS (+)   Anesthesia Other Findings   Reproductive/Obstetrics negative OB ROS                           Anesthesia Physical Anesthesia Plan  ASA: I  Anesthesia Plan: General   Post-op Pain Management:    Induction: Intravenous  Airway Management Planned: LMA  Additional Equipment:   Intra-op Plan:   Post-operative Plan: Extubation in OR  Informed Consent: I have reviewed the patients History and Physical, chart, labs and discussed the procedure including the risks, benefits and alternatives for the proposed anesthesia with the patient or authorized representative who has indicated his/her understanding and acceptance.   Dental advisory given  Plan Discussed with: CRNA  Anesthesia Plan Comments:         Anesthesia Quick Evaluation  
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
## 2012-08-30 NOTE — Transfer of Care (Signed)
Immediate Anesthesia Transfer of Care Note  Patient: Logan Boyle  Procedure(s) Performed: Procedure(s) (LRB) with comments: CLOSED REDUCTION METACARPAL WITH PERCUTANEOUS PINNING (Right)  Patient Location: PACU  Anesthesia Type:General  Level of Consciousness: sedated  Airway & Oxygen Therapy: Patient Spontanous Breathing and Patient connected to face mask oxygen  Post-op Assessment: Report given to PACU RN and Post -op Vital signs reviewed and stable  Post vital signs: Reviewed and stable  Complications: No apparent anesthesia complications

## 2012-08-30 NOTE — Op Note (Signed)
Dictation [id number]

## 2012-08-30 NOTE — Anesthesia Procedure Notes (Signed)
Procedure Name: LMA Insertion Date/Time: 08/30/2012 8:00 AM Performed by: Burna Cash Pre-anesthesia Checklist: Patient identified, Emergency Drugs available, Suction available and Patient being monitored Patient Re-evaluated:Patient Re-evaluated prior to inductionOxygen Delivery Method: Circle System Utilized Preoxygenation: Pre-oxygenation with 100% oxygen Intubation Type: IV induction Ventilation: Mask ventilation without difficulty LMA: LMA inserted LMA Size: 5.0 Number of attempts: 1 Airway Equipment and Method: bite block Placement Confirmation: positive ETCO2 Tube secured with: Tape Dental Injury: Teeth and Oropharynx as per pre-operative assessment

## 2012-08-30 NOTE — Brief Op Note (Signed)
08/30/2012  8:38 AM  PATIENT:  Logan Boyle  21 y.o. male  PRE-OPERATIVE DIAGNOSIS:  right small metacarpal fracture  POST-OPERATIVE DIAGNOSIS:  right small metacarpal fracture  PROCEDURE:  Procedure(s) (LRB) with comments: CLOSED REDUCTION METACARPAL WITH PERCUTANEOUS PINNING (Right)  SURGEON:  Surgeon(s) and Role:    * [name], MD - Primary  PHYSICIAN ASSISTANT:   ASSISTANTS: none   ANESTHESIA:   general  EBL:     BLOOD ADMINISTERED:none  DRAINS: none   LOCAL MEDICATIONS USED:  MARCAINE     SPECIMEN:  No Specimen  DISPOSITION OF SPECIMEN:  N/A  COUNTS:  YES  TOURNIQUET:   Total Tourniquet Time Documented: Upper Arm (Right) - 24 minutes  DICTATION: .Other Dictation: Dictation Number [id number]  PLAN OF CARE: Discharge to home after PACU  PATIENT DISPOSITION:  PACU - hemodynamically stable.

## 2012-08-30 NOTE — Anesthesia Postprocedure Evaluation (Signed)
  Anesthesia Post-op Note  Patient: Logan Boyle  Procedure(s) Performed: Procedure(s) (LRB) with comments: CLOSED REDUCTION METACARPAL WITH PERCUTANEOUS PINNING (Right)  Patient Location: PACU  Anesthesia Type:General  Level of Consciousness: awake, alert  and oriented  Airway and Oxygen Therapy: Patient Spontanous Breathing  Post-op Pain: none  Post-op Assessment: Post-op Vital signs reviewed, Patient's Cardiovascular Status Stable, Respiratory Function Stable, Patent Airway and No signs of Nausea or vomiting  Post-op Vital Signs: Reviewed and stable  Complications: No apparent anesthesia complications

## 2012-08-31 ENCOUNTER — Encounter (HOSPITAL_BASED_OUTPATIENT_CLINIC_OR_DEPARTMENT_OTHER): Payer: Self-pay | Admitting: Orthopedic Surgery

## 2012-08-31 NOTE — ED Provider Notes (Signed)
Medical screening examination/treatment/procedure(s) were performed by non-physician practitioner and as supervising physician I was immediately available for consultation/collaboration.   Joya Gaskins, MD 08/31/12 Marlyne Beards

## 2012-09-05 ENCOUNTER — Ambulatory Visit: Payer: Medicare Other | Attending: Orthopedic Surgery | Admitting: *Deleted

## 2013-02-17 ENCOUNTER — Emergency Department (HOSPITAL_COMMUNITY): Payer: Self-pay

## 2013-02-17 ENCOUNTER — Emergency Department (HOSPITAL_COMMUNITY)
Admission: EM | Admit: 2013-02-17 | Discharge: 2013-02-17 | Disposition: A | Discharge status: Home / Self Care | Payer: Self-pay | Attending: Emergency Medicine | Admitting: Emergency Medicine

## 2013-02-17 ENCOUNTER — Encounter (HOSPITAL_COMMUNITY): Payer: Self-pay | Admitting: Nurse Practitioner

## 2013-02-17 DIAGNOSIS — S62306A Unspecified fracture of fifth metacarpal bone, right hand, initial encounter for closed fracture: Secondary | ICD-10-CM

## 2013-02-17 DIAGNOSIS — S62304A Unspecified fracture of fourth metacarpal bone, right hand, initial encounter for closed fracture: Secondary | ICD-10-CM

## 2013-02-17 DIAGNOSIS — Y929 Unspecified place or not applicable: Secondary | ICD-10-CM | POA: Insufficient documentation

## 2013-02-17 DIAGNOSIS — F17200 Nicotine dependence, unspecified, uncomplicated: Secondary | ICD-10-CM | POA: Insufficient documentation

## 2013-02-17 DIAGNOSIS — Y9389 Activity, other specified: Secondary | ICD-10-CM | POA: Insufficient documentation

## 2013-02-17 DIAGNOSIS — S62309A Unspecified fracture of unspecified metacarpal bone, initial encounter for closed fracture: Secondary | ICD-10-CM | POA: Insufficient documentation

## 2013-02-17 DIAGNOSIS — X58XXXA Exposure to other specified factors, initial encounter: Secondary | ICD-10-CM | POA: Insufficient documentation

## 2013-02-17 MED ORDER — HYDROCODONE-ACETAMINOPHEN 5-325 MG PO TABS
1.0000 | ORAL_TABLET | Freq: Once | ORAL | Status: AC
  Administered 2013-02-17: 2 via ORAL
  Filled 2013-02-17: qty 2

## 2013-02-17 MED ORDER — HYDROCODONE-ACETAMINOPHEN 5-325 MG PO TABS: 1.0000 | ORAL_TABLET | ORAL | Status: DC | PRN

## 2013-02-17 NOTE — Progress Notes (Signed)
Orthopedic Tech Progress Note Patient Details:  Shaughn Thomley May 26, 1991 [id number] Ulna gutter splint applied to Right UE with extra web roll used to protect hand. Patient tolerated application well.   Ortho Devices Type of Ortho Device: Ulna gutter splint Ortho Device/Splint Interventions: Application   Asia R Thompson 02/17/2013, 12:52 PM
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
## 2013-02-18 NOTE — ED Provider Notes (Signed)
Medical screening examination/treatment/procedure(s) were performed by non-physician practitioner and as supervising physician I was immediately available for consultation/collaboration.   Carleene Cooper III, MD 02/18/13 [phone]

## 2013-02-20 ENCOUNTER — Other Ambulatory Visit: Payer: Self-pay | Admitting: Orthopedic Surgery

## 2013-02-20 NOTE — Progress Notes (Signed)
Tried to call x4 no answer-lm x4 Lm to be npo p mn-arrive 745am Bring all meds and a driver

## 2013-02-21 ENCOUNTER — Encounter (HOSPITAL_BASED_OUTPATIENT_CLINIC_OR_DEPARTMENT_OTHER): Payer: Self-pay | Admitting: Anesthesiology

## 2013-02-21 ENCOUNTER — Ambulatory Visit (HOSPITAL_BASED_OUTPATIENT_CLINIC_OR_DEPARTMENT_OTHER): Payer: Self-pay | Admitting: Anesthesiology

## 2013-02-21 ENCOUNTER — Encounter (HOSPITAL_BASED_OUTPATIENT_CLINIC_OR_DEPARTMENT_OTHER)
Admission: RE | Disposition: A | Discharge status: Home / Self Care | Payer: Self-pay | Source: Ambulatory Visit | Attending: Orthopedic Surgery

## 2013-02-21 ENCOUNTER — Ambulatory Visit (HOSPITAL_BASED_OUTPATIENT_CLINIC_OR_DEPARTMENT_OTHER)
Admission: RE | Admit: 2013-02-21 | Discharge: 2013-02-21 | Disposition: A | Discharge status: Home / Self Care | Payer: Self-pay | Source: Ambulatory Visit | Attending: Orthopedic Surgery | Admitting: Orthopedic Surgery

## 2013-02-21 DIAGNOSIS — S62329A Displaced fracture of shaft of unspecified metacarpal bone, initial encounter for closed fracture: Secondary | ICD-10-CM | POA: Insufficient documentation

## 2013-02-21 DIAGNOSIS — R630 Anorexia: Secondary | ICD-10-CM | POA: Insufficient documentation

## 2013-02-21 DIAGNOSIS — IMO0002 Reserved for concepts with insufficient information to code with codable children: Secondary | ICD-10-CM | POA: Insufficient documentation

## 2013-02-21 DIAGNOSIS — F3289 Other specified depressive episodes: Secondary | ICD-10-CM | POA: Insufficient documentation

## 2013-02-21 DIAGNOSIS — F329 Major depressive disorder, single episode, unspecified: Secondary | ICD-10-CM | POA: Insufficient documentation

## 2013-02-21 DIAGNOSIS — Z87828 Personal history of other (healed) physical injury and trauma: Secondary | ICD-10-CM | POA: Insufficient documentation

## 2013-02-21 DIAGNOSIS — J45909 Unspecified asthma, uncomplicated: Secondary | ICD-10-CM | POA: Insufficient documentation

## 2013-02-21 DIAGNOSIS — F17200 Nicotine dependence, unspecified, uncomplicated: Secondary | ICD-10-CM | POA: Insufficient documentation

## 2013-02-21 HISTORY — PX: OPEN REDUCTION INTERNAL FIXATION (ORIF) METACARPAL: SHX6234

## 2013-02-21 LAB — POCT HEMOGLOBIN-HEMACUE: Hemoglobin: 17.3 g/dL — ABNORMAL HIGH (ref 13.0–17.0)

## 2013-02-21 SURGERY — OPEN REDUCTION INTERNAL FIXATION (ORIF) METACARPAL
Anesthesia: General | Site: Finger | Laterality: Right | Wound class: Clean

## 2013-02-21 MED ORDER — LIDOCAINE HCL (CARDIAC) 20 MG/ML IV SOLN
INTRAVENOUS | Status: DC | PRN
  Administered 2013-02-21: 60 mg via INTRAVENOUS

## 2013-02-21 MED ORDER — FENTANYL CITRATE 0.05 MG/ML IJ SOLN: 50.0000 ug | Freq: Once | INTRAMUSCULAR | Status: DC

## 2013-02-21 MED ORDER — HYDROMORPHONE HCL 1 MG/ML PO LIQD: 1.0000 mg | Freq: Four times a day (QID) | ORAL | Status: DC | PRN

## 2013-02-21 MED ORDER — MIDAZOLAM HCL 2 MG/2ML IJ SOLN: 1.0000 mg | INTRAMUSCULAR | Status: DC | PRN

## 2013-02-21 MED ORDER — ONDANSETRON HCL 4 MG/2ML IJ SOLN
INTRAMUSCULAR | Status: DC | PRN
  Administered 2013-02-21: 4 mg via INTRAVENOUS

## 2013-02-21 MED ORDER — FENTANYL CITRATE 0.05 MG/ML IJ SOLN
INTRAMUSCULAR | Status: DC | PRN
  Administered 2013-02-21: 100 ug via INTRAVENOUS
  Administered 2013-02-21 (×7): 25 ug via INTRAVENOUS

## 2013-02-21 MED ORDER — DEXAMETHASONE SODIUM PHOSPHATE 4 MG/ML IJ SOLN
INTRAMUSCULAR | Status: DC | PRN
  Administered 2013-02-21: 8 mg via INTRAVENOUS

## 2013-02-21 MED ORDER — PROPOFOL 10 MG/ML IV BOLUS
INTRAVENOUS | Status: DC | PRN
  Administered 2013-02-21: 200 mg via INTRAVENOUS

## 2013-02-21 MED ORDER — CEFAZOLIN SODIUM-DEXTROSE 2-3 GM-% IV SOLR
2.0000 g | Freq: Once | INTRAVENOUS | Status: AC
  Administered 2013-02-21: 2 g via INTRAVENOUS

## 2013-02-21 MED ORDER — BUPIVACAINE HCL (PF) 0.25 % IJ SOLN
INTRAMUSCULAR | Status: DC | PRN
  Administered 2013-02-21: 9 mL

## 2013-02-21 MED ORDER — LACTATED RINGERS IV SOLN
INTRAVENOUS | Status: DC
  Administered 2013-02-21: 09:00:00 via INTRAVENOUS

## 2013-02-21 MED ORDER — MIDAZOLAM HCL 5 MG/5ML IJ SOLN
INTRAMUSCULAR | Status: DC | PRN
  Administered 2013-02-21: 2 mg via INTRAVENOUS

## 2013-02-21 MED ORDER — HYDROMORPHONE HCL PF 1 MG/ML IJ SOLN
0.2500 mg | INTRAMUSCULAR | Status: DC | PRN
  Administered 2013-02-21 (×3): 0.5 mg via INTRAVENOUS

## 2013-02-21 SURGICAL SUPPLY — 69 items
BANDAGE ELASTIC 3 VELCRO ST LF (GAUZE/BANDAGES/DRESSINGS) ×2 IMPLANT
BANDAGE GAUZE ELAST BULKY 4 IN (GAUZE/BANDAGES/DRESSINGS) ×2 IMPLANT
BIT DRILL 1.1 (BIT) ×1
BIT DRILL 60X20X1.1XQC TMX (BIT) ×1 IMPLANT
BIT DRL 60X20X1.1XQC TMX (BIT) ×1
BLADE MINI RND TIP GREEN BEAV (BLADE) ×2 IMPLANT
BLADE SURG 15 STRL LF DISP TIS (BLADE) ×2 IMPLANT
BLADE SURG 15 STRL SS (BLADE) ×2
BNDG ELASTIC 2 VLCR STRL LF (GAUZE/BANDAGES/DRESSINGS) ×2 IMPLANT
BNDG ESMARK 4X9 LF (GAUZE/BANDAGES/DRESSINGS) ×2 IMPLANT
CHLORAPREP W/TINT 26ML (MISCELLANEOUS) ×2 IMPLANT
CLOTH BEACON ORANGE TIMEOUT ST (SAFETY) ×2 IMPLANT
CORDS BIPOLAR (ELECTRODE) ×2 IMPLANT
COVER MAYO STAND STRL (DRAPES) ×2 IMPLANT
COVER TABLE BACK 60X90 (DRAPES) ×2 IMPLANT
CUFF TOURNIQUET SINGLE 18IN (TOURNIQUET CUFF) ×2 IMPLANT
DRAPE EXTREMITY T 121X128X90 (DRAPE) ×2 IMPLANT
DRAPE OEC MINIVIEW 54X84 (DRAPES) ×2 IMPLANT
DRAPE SURG 17X23 STRL (DRAPES) ×2 IMPLANT
DRIVER BIT 1.5 (TRAUMA) ×2 IMPLANT
GAUZE XEROFORM 1X8 LF (GAUZE/BANDAGES/DRESSINGS) ×2 IMPLANT
GLOVE BIO SURGEON STRL SZ7.5 (GLOVE) ×4 IMPLANT
GLOVE BIOGEL PI IND STRL 8 (GLOVE) IMPLANT
GLOVE BIOGEL PI IND STRL 8.5 (GLOVE) IMPLANT
GLOVE BIOGEL PI INDICATOR 8 (GLOVE)
GLOVE BIOGEL PI INDICATOR 8.5 (GLOVE)
GLOVE ORTHO TXT STRL SZ7.5 (GLOVE) IMPLANT
GLOVE SURG ORTHO 8.0 STRL STRW (GLOVE) IMPLANT
GOWN BRE IMP PREV XXLGXLNG (GOWN DISPOSABLE) ×2 IMPLANT
GOWN PREVENTION PLUS XLARGE (GOWN DISPOSABLE) IMPLANT
GOWN PREVENTION PLUS XXLARGE (GOWN DISPOSABLE) IMPLANT
K-WIRE DBL TRONS .035X6 (WIRE) ×2
KWIRE DBL TRONS .035X6 (WIRE) ×1 IMPLANT
NEEDLE HYPO 22GX1.5 SAFETY (NEEDLE) ×2 IMPLANT
NEEDLE HYPO 25X1 1.5 SAFETY (NEEDLE) IMPLANT
NS IRRIG 1000ML POUR BTL (IV SOLUTION) ×2 IMPLANT
PACK BASIN DAY SURGERY FS (CUSTOM PROCEDURE TRAY) ×2 IMPLANT
PAD CAST 3X4 CTTN HI CHSV (CAST SUPPLIES) ×1 IMPLANT
PAD CAST 4YDX4 CTTN HI CHSV (CAST SUPPLIES) IMPLANT
PADDING CAST ABS 4INX4YD NS (CAST SUPPLIES) ×1
PADDING CAST ABS COTTON 4X4 ST (CAST SUPPLIES) ×1 IMPLANT
PADDING CAST COTTON 3X4 STRL (CAST SUPPLIES) ×1
PADDING CAST COTTON 4X4 STRL (CAST SUPPLIES)
PLATE STRAIGHT LOCK 1.5 (Plate) ×2 IMPLANT
SCREW 1.5X15MM (Screw) ×4 IMPLANT
SCREW 1.5X18MM (Screw) ×2 IMPLANT
SCREW BN 18X1.5XST NONLOCK (Screw) ×2 IMPLANT
SCREW NL 1.5X11 WRIST (Screw) ×2 IMPLANT
SCREW NL 1.5X12 (Screw) ×6 IMPLANT
SCREW NL 1.5X13 (Screw) ×2 IMPLANT
SCREW NONIOC 1.5 14M (Screw) ×2 IMPLANT
SCREW NONIOC 1.5 16M (Screw) ×6 IMPLANT
SLEEVE SCD COMPRESS KNEE MED (MISCELLANEOUS) ×2 IMPLANT
SPLINT PLASTER CAST XFAST 3X15 (CAST SUPPLIES) ×20 IMPLANT
SPLINT PLASTER CAST XFAST 4X15 (CAST SUPPLIES) IMPLANT
SPLINT PLASTER XTRA FAST SET 4 (CAST SUPPLIES)
SPLINT PLASTER XTRA FASTSET 3X (CAST SUPPLIES) ×20
SPONGE GAUZE 4X4 12PLY (GAUZE/BANDAGES/DRESSINGS) ×2 IMPLANT
STOCKINETTE 4X48 STRL (DRAPES) ×2 IMPLANT
SUT ETHILON 3 0 PS 1 (SUTURE) IMPLANT
SUT ETHILON 4 0 PS 2 18 (SUTURE) ×2 IMPLANT
SUT MERSILENE 4 0 P 3 (SUTURE) IMPLANT
SUT VIC AB 3-0 PS1 18 (SUTURE)
SUT VIC AB 3-0 PS1 18XBRD (SUTURE) IMPLANT
SUT VICRYL 4-0 PS2 18IN ABS (SUTURE) ×2 IMPLANT
SYR BULB 3OZ (MISCELLANEOUS) ×2 IMPLANT
SYR CONTROL 10ML LL (SYRINGE) ×2 IMPLANT
TOWEL OR 17X24 6PK STRL BLUE (TOWEL DISPOSABLE) ×2 IMPLANT
UNDERPAD 30X30 INCONTINENT (UNDERPADS AND DIAPERS) ×2 IMPLANT
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
## 2013-02-21 NOTE — Transfer of Care (Signed)
Immediate Anesthesia Transfer of Care Note  Patient: Logan Boyle  Procedure(s) Performed: Procedure(s): OPEN REDUCTION INTERNAL FIXATION RIGHT SMALL AND RING ) METACARPAL FRACTURE  (Right)  Patient Location: PACU  Anesthesia Type:General  Level of Consciousness: awake, alert  and oriented  Airway & Oxygen Therapy: Patient Spontanous Breathing and Patient connected to face mask oxygen  Post-op Assessment: Report given to PACU RN and Post -op Vital signs reviewed and stable  Post vital signs: Reviewed and stable  Complications: No apparent anesthesia complications

## 2013-02-21 NOTE — Brief Op Note (Signed)
02/21/2013  12:20 PM  PATIENT:  Logan Boyle  22 y.o. male  PRE-OPERATIVE DIAGNOSIS:  right small and ring metacarpal fracture   POST-OPERATIVE DIAGNOSIS:  right small and ring metacarpal fracture   PROCEDURE:  Procedure(s): OPEN REDUCTION INTERNAL FIXATION RIGHT SMALL AND RING ) METACARPAL FRACTURE  (Right)  SURGEON:  Surgeon(s) and Role:    * [name], MD - Primary  PHYSICIAN ASSISTANT:   ASSISTANTS: none   ANESTHESIA:   general  EBL:  Total I/O In: 1400 [I.V.:1400] Out: -   BLOOD ADMINISTERED:none  DRAINS: none   LOCAL MEDICATIONS USED:  MARCAINE     SPECIMEN:  No Specimen  DISPOSITION OF SPECIMEN:  N/A  COUNTS:  YES  TOURNIQUET:   Total Tourniquet Time Documented: Upper Arm (Right) - 108 minutes Total: Upper Arm (Right) - 108 minutes   DICTATION: .Other Dictation: Dictation Number [id number]  PLAN OF CARE: Discharge to home after PACU  PATIENT DISPOSITION:  PACU - hemodynamically stable.

## 2013-02-21 NOTE — Op Note (Signed)
[id number] 

## 2013-02-21 NOTE — Anesthesia Postprocedure Evaluation (Signed)
  Anesthesia Post-op Note  Patient: Logan Boyle  Procedure(s) Performed: Procedure(s): OPEN REDUCTION INTERNAL FIXATION RIGHT SMALL AND RING ) METACARPAL FRACTURE  (Right)  Patient Location: PACU  Anesthesia Type:General  Level of Consciousness: awake  Airway and Oxygen Therapy: Patient Spontanous Breathing  Post-op Pain: mild  Post-op Assessment: Post-op Vital signs reviewed  Post-op Vital Signs: Reviewed  Complications: No apparent anesthesia complications

## 2013-02-21 NOTE — Anesthesia Procedure Notes (Signed)
Procedure Name: LMA Insertion Performed by: Emrick Hensch W Pre-anesthesia Checklist: Patient identified, Timeout performed, Emergency Drugs available, Suction available and Patient being monitored Patient Re-evaluated:Patient Re-evaluated prior to inductionOxygen Delivery Method: Circle system utilized Preoxygenation: Pre-oxygenation with 100% oxygen Intubation Type: IV induction Ventilation: Mask ventilation without difficulty LMA: LMA inserted LMA Size: 4.0 Number of attempts: 1 Placement Confirmation: breath sounds checked- equal and bilateral and positive ETCO2 Tube secured with: Tape Dental Injury: Teeth and Oropharynx as per pre-operative assessment      

## 2013-02-21 NOTE — Anesthesia Preprocedure Evaluation (Signed)
Anesthesia Evaluation  Patient identified by MRN, date of birth, ID band Patient awake    Reviewed: Allergy & Precautions, H&P , NPO status , Patient's Chart, lab work & pertinent test results  Airway Mallampati: II      Dental   Pulmonary neg pulmonary ROS,  breath sounds clear to auscultation        Cardiovascular negative cardio ROS  Rhythm:Regular Rate:Normal     Neuro/Psych    GI/Hepatic negative GI ROS, Neg liver ROS,   Endo/Other  negative endocrine ROS  Renal/GU negative Renal ROS     Musculoskeletal   Abdominal   Peds  Hematology   Anesthesia Other Findings   Reproductive/Obstetrics                           Anesthesia Physical Anesthesia Plan  ASA: I  Anesthesia Plan: General   Post-op Pain Management:    Induction: Intravenous  Airway Management Planned: LMA  Additional Equipment:   Intra-op Plan:   Post-operative Plan: Extubation in OR  Informed Consent: I have reviewed the patients History and Physical, chart, labs and discussed the procedure including the risks, benefits and alternatives for the proposed anesthesia with the patient or authorized representative who has indicated his/her understanding and acceptance.   Dental advisory given  Plan Discussed with: CRNA, Anesthesiologist and Surgeon  Anesthesia Plan Comments:         Anesthesia Quick Evaluation

## 2013-02-22 ENCOUNTER — Encounter (HOSPITAL_BASED_OUTPATIENT_CLINIC_OR_DEPARTMENT_OTHER): Payer: Self-pay | Admitting: Orthopedic Surgery
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
## 2013-02-28 ENCOUNTER — Ambulatory Visit: Payer: Medicare Other | Attending: Orthopedic Surgery | Admitting: Occupational Therapy

## 2013-02-28 DIAGNOSIS — M25549 Pain in joints of unspecified hand: Secondary | ICD-10-CM | POA: Insufficient documentation

## 2013-02-28 DIAGNOSIS — IMO0001 Reserved for inherently not codable concepts without codable children: Secondary | ICD-10-CM | POA: Insufficient documentation

## 2013-03-07 ENCOUNTER — Ambulatory Visit: Payer: Medicare Other | Admitting: Occupational Therapy

## 2013-04-30 ENCOUNTER — Encounter (HOSPITAL_BASED_OUTPATIENT_CLINIC_OR_DEPARTMENT_OTHER): Payer: Self-pay | Admitting: Certified Registered Nurse Anesthetist

## 2013-04-30 ENCOUNTER — Ambulatory Visit (HOSPITAL_BASED_OUTPATIENT_CLINIC_OR_DEPARTMENT_OTHER): Payer: Medicare Other | Admitting: Certified Registered Nurse Anesthetist

## 2013-04-30 ENCOUNTER — Ambulatory Visit (HOSPITAL_BASED_OUTPATIENT_CLINIC_OR_DEPARTMENT_OTHER)
Admission: RE | Admit: 2013-04-30 | Discharge: 2013-04-30 | Disposition: A | Discharge status: Home / Self Care | Payer: Medicare Other | Source: Ambulatory Visit | Attending: Orthopedic Surgery | Admitting: Orthopedic Surgery

## 2013-04-30 ENCOUNTER — Encounter (HOSPITAL_BASED_OUTPATIENT_CLINIC_OR_DEPARTMENT_OTHER)
Admission: RE | Disposition: A | Discharge status: Home / Self Care | Payer: Self-pay | Source: Ambulatory Visit | Attending: Orthopedic Surgery

## 2013-04-30 ENCOUNTER — Encounter (HOSPITAL_BASED_OUTPATIENT_CLINIC_OR_DEPARTMENT_OTHER): Payer: Self-pay | Admitting: *Deleted

## 2013-04-30 DIAGNOSIS — Y831 Surgical operation with implant of artificial internal device as the cause of abnormal reaction of the patient, or of later complication, without mention of misadventure at the time of the procedure: Secondary | ICD-10-CM | POA: Insufficient documentation

## 2013-04-30 DIAGNOSIS — Y929 Unspecified place or not applicable: Secondary | ICD-10-CM | POA: Insufficient documentation

## 2013-04-30 DIAGNOSIS — T847XXA Infection and inflammatory reaction due to other internal orthopedic prosthetic devices, implants and grafts, initial encounter: Secondary | ICD-10-CM | POA: Insufficient documentation

## 2013-04-30 DIAGNOSIS — F17200 Nicotine dependence, unspecified, uncomplicated: Secondary | ICD-10-CM | POA: Insufficient documentation

## 2013-04-30 DIAGNOSIS — T84498A Other mechanical complication of other internal orthopedic devices, implants and grafts, initial encounter: Secondary | ICD-10-CM | POA: Insufficient documentation

## 2013-04-30 HISTORY — PX: INCISION AND DRAINAGE: SHX5863

## 2013-04-30 SURGERY — INCISION AND DRAINAGE
Anesthesia: General | Site: Hand | Laterality: Right | Wound class: Dirty or Infected

## 2013-04-30 MED ORDER — MIDAZOLAM HCL 5 MG/5ML IJ SOLN
INTRAMUSCULAR | Status: DC | PRN
  Administered 2013-04-30: 2 mg via INTRAVENOUS

## 2013-04-30 MED ORDER — LIDOCAINE HCL (CARDIAC) 20 MG/ML IV SOLN
INTRAVENOUS | Status: DC | PRN
  Administered 2013-04-30: 60 mg via INTRAVENOUS

## 2013-04-30 MED ORDER — VANCOMYCIN HCL 1000 MG IV SOLR
1000.0000 mg | INTRAVENOUS | Status: DC | PRN
  Administered 2013-04-30: 1000 mg via INTRAVENOUS

## 2013-04-30 MED ORDER — BUPIVACAINE HCL (PF) 0.25 % IJ SOLN
INTRAMUSCULAR | Status: DC | PRN
  Administered 2013-04-30: 5 mL

## 2013-04-30 MED ORDER — 0.9 % SODIUM CHLORIDE (POUR BTL) OPTIME
TOPICAL | Status: DC | PRN
  Administered 2013-04-30: 1000 mL

## 2013-04-30 MED ORDER — FENTANYL CITRATE 0.05 MG/ML IJ SOLN
INTRAMUSCULAR | Status: DC | PRN
  Administered 2013-04-30: 100 ug via INTRAVENOUS

## 2013-04-30 MED ORDER — PROPOFOL 10 MG/ML IV BOLUS
INTRAVENOUS | Status: DC | PRN
  Administered 2013-04-30: 250 mg via INTRAVENOUS

## 2013-04-30 MED ORDER — DEXAMETHASONE SODIUM PHOSPHATE 10 MG/ML IJ SOLN
INTRAMUSCULAR | Status: DC | PRN
  Administered 2013-04-30: 10 mg via INTRAVENOUS

## 2013-04-30 MED ORDER — OXYCODONE HCL 5 MG/5ML PO SOLN: 5.0000 mg | Freq: Once | ORAL | Status: DC | PRN

## 2013-04-30 MED ORDER — LACTATED RINGERS IV SOLN
INTRAVENOUS | Status: DC
  Administered 2013-04-30 (×2): via INTRAVENOUS

## 2013-04-30 MED ORDER — OXYCODONE-ACETAMINOPHEN 5-325 MG PO TABS: ORAL_TABLET | ORAL | Status: DC

## 2013-04-30 MED ORDER — OXYCODONE HCL 5 MG PO TABS: 5.0000 mg | ORAL_TABLET | Freq: Once | ORAL | Status: DC | PRN

## 2013-04-30 MED ORDER — SULFAMETHOXAZOLE-TRIMETHOPRIM 800-160 MG PO TABS: 1.0000 | ORAL_TABLET | Freq: Two times a day (BID) | ORAL | Status: DC

## 2013-04-30 MED ORDER — ONDANSETRON HCL 4 MG/2ML IJ SOLN
INTRAMUSCULAR | Status: DC | PRN
  Administered 2013-04-30: 4 mg via INTRAVENOUS

## 2013-04-30 MED ORDER — HYDROMORPHONE HCL PF 1 MG/ML IJ SOLN
0.2500 mg | INTRAMUSCULAR | Status: DC | PRN
  Administered 2013-04-30: 0.25 mg via INTRAVENOUS

## 2013-04-30 SURGICAL SUPPLY — 51 items
BAG DECANTER FOR FLEXI CONT (MISCELLANEOUS) IMPLANT
BANDAGE ELASTIC 3 VELCRO ST LF (GAUZE/BANDAGES/DRESSINGS) IMPLANT
BANDAGE GAUZE ELAST BULKY 4 IN (GAUZE/BANDAGES/DRESSINGS) ×2 IMPLANT
BANDAGE GAUZE STRT 1 STR LF (GAUZE/BANDAGES/DRESSINGS) IMPLANT
BLADE MINI RND TIP GREEN BEAV (BLADE) IMPLANT
BLADE SURG 15 STRL LF DISP TIS (BLADE) ×2 IMPLANT
BLADE SURG 15 STRL SS (BLADE) ×2
BNDG COHESIVE 1X5 TAN STRL LF (GAUZE/BANDAGES/DRESSINGS) IMPLANT
BNDG ELASTIC 2 VLCR STRL LF (GAUZE/BANDAGES/DRESSINGS) IMPLANT
BNDG ESMARK 4X9 LF (GAUZE/BANDAGES/DRESSINGS) ×2 IMPLANT
CHLORAPREP W/TINT 26ML (MISCELLANEOUS) IMPLANT
CLOTH BEACON ORANGE TIMEOUT ST (SAFETY) ×2 IMPLANT
CORDS BIPOLAR (ELECTRODE) ×2 IMPLANT
COVER MAYO STAND STRL (DRAPES) ×2 IMPLANT
COVER TABLE BACK 60X90 (DRAPES) ×2 IMPLANT
CUFF TOURNIQUET SINGLE 18IN (TOURNIQUET CUFF) ×2 IMPLANT
DRAPE EXTREMITY T 121X128X90 (DRAPE) ×2 IMPLANT
DRAPE SURG 17X23 STRL (DRAPES) ×2 IMPLANT
GAUZE PACKING IODOFORM 1/4X5 (PACKING) ×2 IMPLANT
GAUZE XEROFORM 1X8 LF (GAUZE/BANDAGES/DRESSINGS) ×2 IMPLANT
GLOVE BIO SURGEON STRL SZ 6.5 (GLOVE) ×2 IMPLANT
GLOVE BIO SURGEON STRL SZ7.5 (GLOVE) ×2 IMPLANT
GLOVE BIOGEL M 7.0 STRL (GLOVE) ×2 IMPLANT
GLOVE BIOGEL PI IND STRL 8 (GLOVE) ×1 IMPLANT
GLOVE BIOGEL PI IND STRL 8.5 (GLOVE) ×1 IMPLANT
GLOVE BIOGEL PI INDICATOR 8 (GLOVE) ×1
GLOVE BIOGEL PI INDICATOR 8.5 (GLOVE) ×1
GLOVE EXAM NITRILE LRG STRL (GLOVE) ×2 IMPLANT
GLOVE SURG ORTHO 8.0 STRL STRW (GLOVE) ×2 IMPLANT
GOWN BRE IMP PREV XXLGXLNG (GOWN DISPOSABLE) ×4 IMPLANT
GOWN PREVENTION PLUS XLARGE (GOWN DISPOSABLE) ×2 IMPLANT
LOOP VESSEL MAXI BLUE (MISCELLANEOUS) IMPLANT
NEEDLE HYPO 25X1 1.5 SAFETY (NEEDLE) ×2 IMPLANT
NS IRRIG 1000ML POUR BTL (IV SOLUTION) ×2 IMPLANT
PACK BASIN DAY SURGERY FS (CUSTOM PROCEDURE TRAY) ×2 IMPLANT
PAD CAST 3X4 CTTN HI CHSV (CAST SUPPLIES) ×1 IMPLANT
PADDING CAST ABS 4INX4YD NS (CAST SUPPLIES)
PADDING CAST ABS COTTON 4X4 ST (CAST SUPPLIES) IMPLANT
PADDING CAST COTTON 3X4 STRL (CAST SUPPLIES) ×1
SPLINT PLASTER CAST XFAST 3X15 (CAST SUPPLIES) IMPLANT
SPLINT PLASTER XTRA FASTSET 3X (CAST SUPPLIES)
SPONGE GAUZE 4X4 12PLY (GAUZE/BANDAGES/DRESSINGS) ×2 IMPLANT
STOCKINETTE 4X48 STRL (DRAPES) ×2 IMPLANT
SUT ETHILON 4 0 PS 2 18 (SUTURE) IMPLANT
SWAB COLLECTION DEVICE MRSA (MISCELLANEOUS) ×2 IMPLANT
SYR BULB 3OZ (MISCELLANEOUS) ×2 IMPLANT
SYR CONTROL 10ML LL (SYRINGE) ×2 IMPLANT
TOWEL OR 17X24 6PK STRL BLUE (TOWEL DISPOSABLE) ×2 IMPLANT
TUBE ANAEROBIC SPECIMEN COL (MISCELLANEOUS) ×2 IMPLANT
TUBE FEEDING 5FR 15 INCH (TUBING) IMPLANT
UNDERPAD 30X30 INCONTINENT (UNDERPADS AND DIAPERS) ×2 IMPLANT

## 2013-04-30 NOTE — Brief Op Note (Signed)
04/30/2013  11:57 AM  PATIENT:  [name]  22 y.o. male  PRE-OPERATIVE DIAGNOSIS:  Right hand infection  POST-OPERATIVE DIAGNOSIS:  Right hand infection  PROCEDURE:  Procedure(s): INCISION AND DRAINAGE, HARDWARE REMOVAL RIGHT HAND  SURGEON:  Surgeon(s): [name], MD Nicki Reaper, MD  PHYSICIAN ASSISTANT:   ASSISTANTS: Cindee Salt, MD   ANESTHESIA:   general  EBL:     DRAINS: iodoform packing  LOCAL MEDICATIONS USED:  MARCAINE     SPECIMEN:  Source of Specimen:  right hand  DISPOSITION OF SPECIMEN:  micro  COUNTS:  YES  TOURNIQUET:   Total Tourniquet Time Documented: Upper Arm (Right) - 14 minutes Total: Upper Arm (Right) - 14 minutes   DICTATION: .Other Dictation: Dictation Number [id number]  PLAN OF CARE: Discharge to home after PACU

## 2013-04-30 NOTE — Anesthesia Postprocedure Evaluation (Signed)
  Anesthesia Post-op Note  Patient: Logan Boyle  Procedure(s) Performed: Procedure(s): INCISION AND DRAINAGE, HARDWARE REMOVAL RIGHT HAND (Right)  Patient Location: PACU  Anesthesia Type:General  Level of Consciousness: awake, alert  and oriented  Airway and Oxygen Therapy: Patient Spontanous Breathing  Post-op Pain: mild  Post-op Assessment: Post-op Vital signs reviewed, Patient's Cardiovascular Status Stable, Respiratory Function Stable, Patent Airway and No signs of Nausea or vomiting  Post-op Vital Signs: Reviewed and stable  Complications: No apparent anesthesia complications

## 2013-04-30 NOTE — Anesthesia Preprocedure Evaluation (Addendum)
Anesthesia Evaluation  Patient identified by MRN, date of birth, ID band Patient awake    Reviewed: Allergy & Precautions, H&P , NPO status , Patient's Chart, lab work & pertinent test results  Airway Mallampati: I TM Distance: >3 FB Neck ROM: Full    Dental no notable dental hx. (+) Teeth Intact and Dental Advisory Given   Pulmonary Current Smoker,  breath sounds clear to auscultation  Pulmonary exam normal       Cardiovascular negative cardio ROS  Rhythm:Regular Rate:Normal     Neuro/Psych negative neurological ROS  negative psych ROS   GI/Hepatic negative GI ROS, Neg liver ROS,   Endo/Other  negative endocrine ROS  Renal/GU negative Renal ROS  negative genitourinary   Musculoskeletal   Abdominal   Peds  Hematology negative hematology ROS (+)   Anesthesia Other Findings   Reproductive/Obstetrics negative OB ROS                          Anesthesia Physical Anesthesia Plan  ASA: II  Anesthesia Plan: General   Post-op Pain Management:    Induction: Intravenous  Airway Management Planned: LMA  Additional Equipment:   Intra-op Plan:   Post-operative Plan: Extubation in OR  Informed Consent: I have reviewed the patients History and Physical, chart, labs and discussed the procedure including the risks, benefits and alternatives for the proposed anesthesia with the patient or authorized representative who has indicated his/her understanding and acceptance.   Dental advisory given  Plan Discussed with: CRNA  Anesthesia Plan Comments:         Anesthesia Quick Evaluation

## 2013-04-30 NOTE — Op Note (Signed)
[id number] 

## 2013-04-30 NOTE — H&P (Signed)
Logan Boyle is an 22 y.o. male.   Chief Complaint: right hand wound HPI: 22 yo rhd male 2 months s/p orif right ring and small metacarpal fractures.  Three days ago noted tenting of skin dorsum right hand.  Over past 2 days, increased swelling, then wound on dorsum of hand with drainage.  No new injuries noted.  No fevers, chills, night sweats.  Past Medical History  Diagnosis Date  . No pertinent past medical history   . Hand fracture, right 2013    x 2     Past Surgical History  Procedure Laterality Date  . Fasciotomy  02/16/2012    Procedure: FASCIOTOMY;  Surgeon: [name], MD;  Location: [facility];  Service: Orthopedics;  Laterality: Right;  dorsal hand  . Closed reduction metacarpal with percutaneous pinning  08/30/2012    Procedure: CLOSED REDUCTION METACARPAL WITH PERCUTANEOUS PINNING;  Surgeon: [name], MD;  Location: [facility];  Service: Orthopedics;  Laterality: Right;  . Open reduction internal fixation (orif) metacarpal Right 02/21/2013    Procedure: OPEN REDUCTION INTERNAL FIXATION RIGHT SMALL AND RING ) METACARPAL FRACTURE ;  Surgeon: [name], MD;  Location: [facility];  Service: Orthopedics;  Laterality: Right;    History reviewed. No pertinent family history. Social History:  reports that he has been smoking Cigarettes.  He has been smoking about 0.50 packs per day. He does not have any smokeless tobacco history on file. He reports that he does not drink alcohol or use illicit drugs.  Allergies: No Known Allergies  Medications Prior to Admission  Medication Sig Dispense Refill  . HYDROcodone-acetaminophen (NORCO/VICODIN) 5-325 MG per tablet Take 1 tablet by mouth every 4 (four) hours as needed for pain.  12 tablet  0    No results found for this or any previous visit (from the past 48 hour(s)).  No results found.   A comprehensive review of systems was negative.  Blood pressure 134/81, pulse 69, temperature 98.3 F (36.8  C), temperature source Oral, resp. rate 16, height 5\' 8"  (1.727 m), weight 171 lb 3.2 oz (77.656 kg), SpO2 100.00%.  General appearance: alert, cooperative and appears stated age Head: Normocephalic, without obvious abnormality, atraumatic Neck: supple, symmetrical, trachea midline Resp: clear to auscultation bilaterally Cardio: regular rate and rhythm GI: non tender Extremities: intact sensation and capillary refill all digits.  +epl/fpl/io.  small wound on dorsum right hand with drainage.  swelling on dorsum of hand.  no erythema or proximal streaks. Pulses: 2+ and symmetric Skin: as above Neurologic: Grossly normal Incision/Wound: As above  Assessment/Plan Right hand retained hardware with loosening and infection, possible osteomyelitis.  Non operative and operative treatment options were discussed with the patient and patient wishes to proceed with operative treatment.  Recommend OR for I&D of wound and metacarpal as necessary with removal of loose screw and possibly one or both plates.  Risks, benefits, and alternatives of surgery were discussed and the patient agrees with the plan of care.   Logan Boyle R 04/30/2013, 11:01 AM

## 2013-04-30 NOTE — Anesthesia Procedure Notes (Signed)
Procedure Name: LMA Insertion Date/Time: 04/30/2013 11:27 AM Performed by: Nohlan Burdin D Pre-anesthesia Checklist: Patient identified, Emergency Drugs available, Suction available and Patient being monitored Patient Re-evaluated:Patient Re-evaluated prior to inductionOxygen Delivery Method: Circle System Utilized Preoxygenation: Pre-oxygenation with 100% oxygen Intubation Type: IV induction Ventilation: Mask ventilation without difficulty LMA: LMA inserted LMA Size: 5.0 Number of attempts: 1 Airway Equipment and Method: bite block Placement Confirmation: positive ETCO2 Tube secured with: Tape Dental Injury: Teeth and Oropharynx as per pre-operative assessment

## 2013-04-30 NOTE — Transfer of Care (Signed)
Immediate Anesthesia Transfer of Care Note  Patient: Logan Boyle  Procedure(s) Performed: Procedure(s): INCISION AND DRAINAGE, HARDWARE REMOVAL RIGHT HAND (Right)  Patient Location: PACU  Anesthesia Type:General  Level of Consciousness: awake, alert , oriented and patient cooperative  Airway & Oxygen Therapy: Patient Spontanous Breathing and Patient connected to face mask oxygen  Post-op Assessment: Report given to PACU RN and Post -op Vital signs reviewed and stable  Post vital signs: Reviewed and stable  Complications: No apparent anesthesia complications

## 2013-05-01 ENCOUNTER — Encounter (HOSPITAL_BASED_OUTPATIENT_CLINIC_OR_DEPARTMENT_OTHER): Payer: Self-pay | Admitting: Orthopedic Surgery

## 2013-05-01 NOTE — Op Note (Signed)
NAMEDAMAURI, MINION                  ACCOUNT NO.:  [id number]  MEDICAL RECORD NO.:  [id number]  LOCATION:                               FACILITY:  [facility]  PHYSICIAN:  Betha Loa, MD        DATE OF BIRTH:  1991/03/01  DATE OF PROCEDURE:  04/30/2013 DATE OF DISCHARGE:  04/30/2013                              OPERATIVE REPORT   PREOPERATIVE DIAGNOSES:  Right hand wound infection and loosened hardware.  POSTOPERATIVE DIAGNOSES:  Right hand wound infection and loosened hardware with possible osteomyelitis.  PROCEDURE:  Irrigation and debridement of right hand and removal of loose screw.  SURGEON:  Betha Loa, MD  ASSISTANT:  Cindee Salt, M.D.  ANESTHESIA:  General.  IV FLUIDS:  Per anesthesia flow sheet.  ESTIMATED BLOOD LOSS:  Minimal.  COMPLICATIONS:  None.  SPECIMENS:  Right hand cultures to Micro.  TOURNIQUET TIME:  14 minutes.  DISPOSITION:  Stable to PACU.  INDICATIONS:  Mr. Wende Crease is a 22 year old right-hand dominant male who underwent open reduction and internal fixation of right ring and small finger metacarpal fractures 2 months ago.  He states 3 days ago, he began to have swelling and tenting on the dorsum of the hand.  Couple of days ago, the area came to ahead and broke opened and he states pus came out.  He presented to the office this morning for radiographs and followed up with me in the [facility].  On examination, he had a small wound on the dorsum of the hand.  There was surrounding swelling, but no erythema.  There was active drainage.  There was no proximal streaking.  It was minimally tender.  Radiographs showed backing out of one screw.  The small finger metacarpal fracture entirely healed, the ring finger metacarpal fracture was still visible.  I recommended Mr. Hamm going to the operating room for irrigation and debridement of the wound, removal of the loose screw with potential removal of other hardware and potential I and D of the bone.   Risks, benefits and alternatives of the surgery were discussed including the risk of blood loss; infection; damage to nerves, vessels, tendons, ligaments, bone; failure of surgery; need for additional surgery; complications with wound healing; continued pain; continued infection; need for repeat irrigation and debridement.  He voiced understanding of these risks and elected to proceed.  OPERATIVE COURSE:  After being identified preoperatively by myself, the patient and I agreed upon the procedure and site procedure.  Surgical site was marked.  The risks, benefits, and alternatives of surgery were reviewed and he wished to proceed.  Surgical consent had been signed. Antibiotics were held for cultures.  He was transferred to the operating room and placed on the operating room table in supine position with the right upper extremity on an armboard.  General anesthesia was induced by the anesthesiologist.  The right upper extremity was prepped and draped in normal sterile orthopedic fashion.  A surgical pause was performed between the surgeons, anesthesia, and operating staff, and all were in agreement as to the patient, procedure, and site of procedure. Tourniquet at the proximal aspect of the extremity was inflated to 250  mmHg after gravity exsanguination of the hand with Esmarch exsanguination of the forearm.  Incision was made on the dorsum of the hand using the previous incision line.  The wound was included in this. This was carried into subcutaneous tissues by spreading technique. There was no gross purulence, but there was fluid and fibrinous tissue. The screw was easily identified and was removed with forceps.  Cultures were taken for aerobes, anaerobes, AFB, and fungus.  Rongeurs were used to clear devitalized tissue and fibrinous tissue away.  The wound bed was excised using the rongeur.  A House curette was able to be placed through the screw hole on the plate into the bone.  This  area curetted out.  There was no weak part of the bone outside of the screw hole.  The plate itself was well fixed to the bone.  It was not felt that it was necessary to remove the plate at this time.  A 20-gauge Angiocath needle was used to irrigate from within the bone.  The wound was copiously irrigated with sterile saline.  It was then packed open with quarter- inch iodoform gauze.  It was injected with 5 mL of 0.25% plain Marcaine to aid in postoperative analgesia.  He was then dressed with sterile Xeroform, 4x4s, and wrapped with a Kerlix and a Coban dressing lightly. Tourniquet was deflated at 14 minutes.  Fingertips were pink with brisk capillary refill after deflating the tourniquet.  Operative drapes were broken down and the patient was awoken from anesthesia safely.  He was transferred back to stretcher and taken to PACU in stable condition.  I will see him back in the office in 3 days for postoperative followup.  I will give him Percocet 5/325 1-2 p.o. q.6 hours p.r.n. pain, dispensed #30 and Bactrim DS 1 p.o. b.i.d. x7 days.  We will also work on referral to Infectious Disease for their assistance in treating possible osteomyelitis.     Betha Loa, MD     KK/MEDQ  D:  04/30/2013  T:  05/01/2013  Job:  [id number]

## 2013-05-02 LAB — WOUND CULTURE

## 2013-05-05 LAB — ANAEROBIC CULTURE

## 2013-05-16 ENCOUNTER — Ambulatory Visit: Payer: Medicare Other | Admitting: Internal Medicine

## 2013-05-28 LAB — FUNGUS CULTURE W SMEAR: Fungal Smear: NONE SEEN

## 2013-06-12 LAB — AFB CULTURE WITH SMEAR (NOT AT ARMC): Acid Fast Smear: NONE SEEN

## 2014-02-01 ENCOUNTER — Emergency Department (HOSPITAL_COMMUNITY)
Admission: EM | Admit: 2014-02-01 | Discharge: 2014-02-01 | Disposition: A | Discharge status: Home / Self Care | Payer: Medicare Other | Attending: Emergency Medicine | Admitting: Emergency Medicine

## 2014-02-01 ENCOUNTER — Emergency Department (HOSPITAL_COMMUNITY): Payer: Medicare Other

## 2014-02-01 ENCOUNTER — Encounter (HOSPITAL_COMMUNITY): Payer: Self-pay | Admitting: Emergency Medicine

## 2014-02-01 DIAGNOSIS — Z8781 Personal history of (healed) traumatic fracture: Secondary | ICD-10-CM | POA: Insufficient documentation

## 2014-02-01 DIAGNOSIS — X503XXA Overexertion from repetitive movements, initial encounter: Secondary | ICD-10-CM | POA: Insufficient documentation

## 2014-02-01 DIAGNOSIS — F17200 Nicotine dependence, unspecified, uncomplicated: Secondary | ICD-10-CM | POA: Insufficient documentation

## 2014-02-01 DIAGNOSIS — Y9389 Activity, other specified: Secondary | ICD-10-CM | POA: Insufficient documentation

## 2014-02-01 DIAGNOSIS — S6990XA Unspecified injury of unspecified wrist, hand and finger(s), initial encounter: Secondary | ICD-10-CM | POA: Insufficient documentation

## 2014-02-01 DIAGNOSIS — M79641 Pain in right hand: Secondary | ICD-10-CM

## 2014-02-01 DIAGNOSIS — T84498A Other mechanical complication of other internal orthopedic devices, implants and grafts, initial encounter: Secondary | ICD-10-CM | POA: Insufficient documentation

## 2014-02-01 DIAGNOSIS — Y929 Unspecified place or not applicable: Secondary | ICD-10-CM | POA: Insufficient documentation

## 2014-02-01 DIAGNOSIS — Y831 Surgical operation with implant of artificial internal device as the cause of abnormal reaction of the patient, or of later complication, without mention of misadventure at the time of the procedure: Secondary | ICD-10-CM | POA: Insufficient documentation

## 2014-02-01 MED ORDER — HYDROCODONE-ACETAMINOPHEN 5-325 MG PO TABS
1.0000 | ORAL_TABLET | Freq: Once | ORAL | Status: AC
  Administered 2014-02-01: 1 via ORAL
  Filled 2014-02-01: qty 1

## 2014-02-01 MED ORDER — HYDROCODONE-ACETAMINOPHEN 5-325 MG PO TABS: 1.0000 | ORAL_TABLET | Freq: Four times a day (QID) | ORAL | Status: DC | PRN

## 2014-02-01 NOTE — ED Notes (Signed)
Pt. Stated, I fell on my hand on Wednesday and it was ok and then yesterday i helped a friend move and this morning it was killing me.

## 2014-02-01 NOTE — ED Notes (Signed)
Pt returns from xray

## 2014-02-01 NOTE — ED Provider Notes (Signed)
Medical screening examination/treatment/procedure(s) were performed by non-physician practitioner and as supervising physician I was immediately available for consultation/collaboration.   EKG Interpretation None        Megan E Docherty, MD 02/01/14 2056 

## 2014-02-01 NOTE — Discharge Instructions (Signed)
Followup in the office on either Tuesday or Thursday with Dr. Merlyn LotKuzma or Dr. Mina MarbleWeingold. Recommend he take Norco as needed for pain control. Maintain your splint for comfort. Return if symptoms worsen.  Arthralgia Arthralgia is joint pain. A joint is a place where two bones meet. Joint pain can happen for many reasons. The joint can be bruised, stiff, infected, or weak from aging. Pain usually goes away after resting and taking medicine for soreness.  HOME CARE  Rest the joint as told by your doctor.  Keep the sore joint raised (elevated) for the first 24 hours.  Put ice on the joint area.  Put ice in a plastic bag.  Place a towel between your skin and the bag.  Leave the ice on for 15-20 minutes, 03-04 times a day.  Wear your splint, casting, elastic bandage, or sling as told by your doctor.  Only take medicine as told by your doctor. Do not take aspirin.  Use crutches as told by your doctor. Do not put weight on the joint until told to by your doctor. GET HELP RIGHT AWAY IF:   You have bruising, puffiness (swelling), or more pain.  Your fingers or toes turn blue or start to lose feeling (numb).  Your medicine does not lessen the pain.  Your pain becomes severe.  You have a temperature by mouth above 102 F (38.9 C), not controlled by medicine.  You cannot move or use the joint. MAKE SURE YOU:   Understand these instructions.  Will watch your condition.  Will get help right away if you are not doing well or get worse. Document Released: 08/31/2009 Document Revised: 12/05/2011 Document Reviewed: 08/31/2009 [facility] Patient Information 2014 [place]Care, [place]. RICE: Routine Care for Injuries Rest, Ice, Compression, and Elevation (RICE) are often used to care for injuries. HOME CARE  Rest your injury.  Put ice on the injury.  Put ice in a plastic bag.  Place a towel between your skin and the bag.  Leave the ice on for 15-20 minutes, 03-04 times a day. Do this for as  long as told by your doctor.  Apply pressure (compression) with an elastic bandage. Remove and reapply the bandage every 3 to 4 hours. Do not wrap the bandage too tight. Wrap the bandage looser if the fingers or toes are puffy (swollen), blue, cold, painful, or lose feeling (numb).  Raise (elevate) your injury. Raise your injury above the heart if you can. GET HELP RIGHT AWAY IF:  You have lasting pain or puffiness.  Your injury is red, weak, or loses feeling.  Your problems get worse, not better, after several days. MAKE SURE YOU:  Understand these instructions.  Will watch your condition.  Will get help right away if you are not doing well or get worse. Document Released: 02/29/2008 Document Revised: 12/05/2011 Document Reviewed: 02/11/2011 [facility] Patient Information 2014 [place], [place].

## 2014-02-01 NOTE — ED Provider Notes (Signed)
CSN: [id number]     Arrival date & time 02/01/14  1122 History  This chart was scribed for non-physician practitioner working with Shanna CiscoMegan E Docherty, MD by Ashley JacobsBrittany Andrews, ED scribe. This patient was seen in room TR05C/TR05C and the patient's care was started at 12:19 PM.  First MD Initiated Contact with Patient 02/01/14 1146     Chief Complaint  Patient presents with  . Hand Pain     (Consider location/radiation/quality/duration/timing/severity/associated sxs/prior Treatment) Patient is a 23 y.o. male presenting with hand pain. The history is provided by the patient and medical records. No language interpreter was used.  Hand Pain This is a new problem. The current episode started more than 2 days ago. The problem occurs constantly. The symptoms are aggravated by bending. Nothing relieves the symptoms. He has tried nothing for the symptoms.   HPI Comments: Logan Boyle is a 23 y.o. male who presents to the Emergency Department complaining of constant, moderate right hand pain after an injury that occurred three days ago. The pain is much worse this morning. Pt fell on his R hand 3 days ago. Pain from this fall resolved yesterday. He then experienced worsening pain this morning after helping his friend move heavy furniture. He did not try anything for pain. Nothing seems to make the pain better. He experiences pain in his wrist with R wrist flexion. He has swelling to his right hand. Pt had prior right hand surgery from a prior fracture.     Past Medical History  Diagnosis Date  . No pertinent past medical history   . Hand fracture, right 2013    x 2    Past Surgical History  Procedure Laterality Date  . Fasciotomy  02/16/2012    Procedure: FASCIOTOMY;  Surgeon: Tami RibasKevin R Kuzma, MD;  Location: [facility];  Service: Orthopedics;  Laterality: Right;  dorsal hand  . Closed reduction metacarpal with percutaneous pinning  08/30/2012    Procedure: CLOSED REDUCTION METACARPAL WITH PERCUTANEOUS PINNING;   Surgeon: Tami RibasKevin R Kuzma, MD;  Location: [facility];  Service: Orthopedics;  Laterality: Right;  . Open reduction internal fixation (orif) metacarpal Right 02/21/2013    Procedure: OPEN REDUCTION INTERNAL FIXATION RIGHT SMALL AND RING ) METACARPAL FRACTURE ;  Surgeon: Tami RibasKevin R Kuzma, MD;  Location: [facility];  Service: Orthopedics;  Laterality: Right;  . Incision and drainage Right 04/30/2013    Procedure: INCISION AND DRAINAGE, HARDWARE REMOVAL RIGHT HAND;  Surgeon: Tami RibasKevin R Kuzma, MD;  Location: [facility];  Service: Orthopedics;  Laterality: Right;   No family history on file. History  Substance Use Topics  . Smoking status: Current Every Day Smoker -- 0.50 packs/day    Types: Cigarettes  . Smokeless tobacco: Not on file  . Alcohol Use: No    Review of Systems  Constitutional: Negative for fever.  Musculoskeletal: Positive for arthralgias.       Right hand pain and swelling   All other systems reviewed and are negative.     Allergies  Review of patient's allergies indicates no known allergies.  Home Medications   Prior to Admission medications   Not on File   BP 151/71  Pulse 82  Temp(Src) 98.2 F (36.8 C) (Oral)  Resp 16  Ht 5\' 8"  (1.727 m)  Wt 169 lb (76.658 kg)  BMI 25.70 kg/m2  SpO2 100%  Physical Exam  Nursing note and vitals reviewed. Constitutional: He is oriented to person, place, and time. He appears well-developed  and well-nourished. No distress.  HENT:  Head: Normocephalic and atraumatic.  Eyes: Conjunctivae and EOM are normal. No scleral icterus.  Neck: Normal range of motion.  Cardiovascular: Normal rate, regular rhythm and intact distal pulses.   Distal radial pulse 2+ in RUE. Capillary refill normal in all digits of R hand.  Pulmonary/Chest: Effort normal. No respiratory distress.  Musculoskeletal: Normal range of motion. He exhibits tenderness.  Good ROM of R wrist joint. Patient able to make fist. Well  healed incision to dorsal surface of R hand without erythema, red streaking, or heat to touch. There is mild swelling at and around incision site with mild TTP. I do not appreciate any fluctuance. There is no purulent drainage.  Neurological: He is alert and oriented to person, place, and time.  No gross sensory deficits appreciated. Patient able to wiggle all fingers. Finger to thumb opposition intact in R hand. 5/5 strength against resistance of R wrist.  Skin: Skin is warm and dry. No rash noted. He is not diaphoretic. No erythema. No pallor.  Psychiatric: He has a normal mood and affect. His behavior is normal.    ED Course  Procedures (including critical care time) DIAGNOSTIC STUDIES: Oxygen Saturation is 100% on room air, normal by my interpretation.    COORDINATION OF CARE:  12:21 PM Discussed course of care with pt . Pt understands and agrees.   Labs Review Labs Reviewed - No data to display  Imaging Review Dg Hand Complete Right  02/01/2014   CLINICAL DATA:  Larey SeatFell last night, pain and swelling  EXAM: RIGHT HAND - COMPLETE 3+ VIEW  COMPARISON:  Most recent prior radiographs of the hand 02/17/2013  FINDINGS: Surgical changes of prior ORIF of fourth and fifth metacarpal fractures. The fifth metacarpal fracture appears well healed an without evidence of hardware complication. There is some persistent lucency in the region of fracture site of the fourth metacarpal although the cortex appears largely intact. Of note, second and third (counting the most distal screw as  #1), the second screw appears to the back down by approximately 3 mm with respect to the malleable plate while the third screw has backed out approximately 2 mm. There is soft tissue swelling over dorsum of the hand. No new acute fracture or malalignment identified.  IMPRESSION: 1. Residual lucency within the fourth metacarpal in the region of the prior fracture may represent areas of continued healing. However, in the  appropriate clinical setting osteomyelitis would be difficult to exclude entirely. 2. The second and third distal most screws in the fourth metacarpal ORIF construct have backed out with respect the malleable plate as detailed above. 3. Well healed fifth metacarpal fracture status post ORIF without evidence of hardware complication.   Electronically Signed   By: Malachy MoanHeath  McCullough M.D.   On: 02/01/2014 12:48     EKG Interpretation None      MDM   Final diagnoses:  Hand pain, right  Internal fixation device (pin, rod, or screw) mechanical complication    23 year old male with a history of fourth and fifth metacarpal fractures of his right hand, s/p ORIF of 4th and 5th metacarpals in 04/2013 by Dr. Betha LoaKevin Kuzma. Patient fell on his hand 3 days ago. Pain from this fall resolved yesterday, but then returned and worsened this morning after helping his friend move heavy furniture. Patient is neurovascularly intact on physical exam. No gross sensory deficits appreciated. Patient noted to have swelling at prior surgical site without erythema, heat to touch, or  red linear streaking. Patient denies fevers and physical exam does not suggest acute infection.  Imaging obtained which shows back out of the second and third distal screws of fourth metacarpal ORIF. There is also some lucency noted to the fourth metacarpal consistent with continued healing versus osteomyelitis. Physical exam today does not suggest acute infection, therefore, my suspicion for osteomyelitis in this patient is low; pain was sudden and "traumatic" in onset as well. I have consulted with Dr. Mina Marble who has reviewed the imaging. He recommends placement of an ulnar gutter splint for comfort as well as outpatient followup on T/Th with either himself or Dr. Betha Loa who performed the surgery. Findings and plan reviewed with patient who verbalizes comfort and understanding. He is stable for discharge with prescription for Norco for pain  control as needed. Return precautions discussed and provided. Patient agreeable to plan with no unaddressed concerns.  I personally performed the services described in this documentation, which was scribed in my presence. The recorded information has been reviewed and is accurate.     Antony Madura, PA-C 02/01/14 1326

## 2014-02-01 NOTE — ED Notes (Signed)
Patient transported to X-ray 

## 2014-02-01 NOTE — ED Notes (Signed)
Onset Wednesday pt stood up and passed out, fell on right hand.  Right hand swollen and painful, progressively worsening since injury.  Pt able to move hand at wrist, painful when pulling hand up.  Middle finger painful.  Pt has prior plates in the right hand at site of swelling, surgery 1 year ago.

## 2014-02-01 NOTE — ED Notes (Signed)
Ortho tech at bedside 

## 2014-02-01 NOTE — Progress Notes (Signed)
Orthopedic Tech Progress Note Patient Details:  Katrinka BlazingLarry Hamm 1990-10-22 [id number] Ulna gutter applied to RUE. Arm sling applied. Ortho Devices Type of Ortho Device: Ace wrap;Arm sling;Ulna gutter splint Ortho Device/Splint Location: RUE Ortho Device/Splint Interventions: Application   Asia R Thompson 02/01/2014, 1:44 PM

## 2014-08-06 ENCOUNTER — Encounter (HOSPITAL_COMMUNITY): Payer: Self-pay | Admitting: Emergency Medicine

## 2014-08-06 ENCOUNTER — Emergency Department (HOSPITAL_COMMUNITY)
Admission: EM | Admit: 2014-08-06 | Discharge: 2014-08-06 | Disposition: A | Discharge status: Home / Self Care | Payer: Medicare Other | Attending: Emergency Medicine | Admitting: Emergency Medicine

## 2014-08-06 ENCOUNTER — Emergency Department (HOSPITAL_COMMUNITY): Payer: Medicare Other

## 2014-08-06 DIAGNOSIS — Y92009 Unspecified place in unspecified non-institutional (private) residence as the place of occurrence of the external cause: Secondary | ICD-10-CM | POA: Diagnosis not present

## 2014-08-06 DIAGNOSIS — Y998 Other external cause status: Secondary | ICD-10-CM | POA: Diagnosis not present

## 2014-08-06 DIAGNOSIS — T8484XA Pain due to internal orthopedic prosthetic devices, implants and grafts, initial encounter: Secondary | ICD-10-CM | POA: Diagnosis not present

## 2014-08-06 DIAGNOSIS — Z8781 Personal history of (healed) traumatic fracture: Secondary | ICD-10-CM | POA: Diagnosis not present

## 2014-08-06 DIAGNOSIS — R2231 Localized swelling, mass and lump, right upper limb: Secondary | ICD-10-CM | POA: Diagnosis present

## 2014-08-06 DIAGNOSIS — X58XXXA Exposure to other specified factors, initial encounter: Secondary | ICD-10-CM | POA: Diagnosis not present

## 2014-08-06 DIAGNOSIS — Z720 Tobacco use: Secondary | ICD-10-CM | POA: Insufficient documentation

## 2014-08-06 DIAGNOSIS — Y9389 Activity, other specified: Secondary | ICD-10-CM | POA: Insufficient documentation

## 2014-08-06 MED ORDER — OXYCODONE-ACETAMINOPHEN 5-325 MG PO TABS: 1.0000 | ORAL_TABLET | Freq: Four times a day (QID) | ORAL | Status: DC | PRN

## 2014-08-06 MED ORDER — OXYCODONE-ACETAMINOPHEN 5-325 MG PO TABS
2.0000 | ORAL_TABLET | Freq: Once | ORAL | Status: AC
  Administered 2014-08-06: 2 via ORAL
  Filled 2014-08-06: qty 2

## 2014-08-06 MED ORDER — IBUPROFEN 800 MG PO TABS
800.0000 mg | ORAL_TABLET | Freq: Once | ORAL | Status: AC
  Administered 2014-08-06: 800 mg via ORAL
  Filled 2014-08-06: qty 1

## 2014-08-06 NOTE — ED Notes (Signed)
Pt A+Ox4, reports lot of heavy lifting and use of hands typically for job and reports R hand swelling x1 week, pain with movement of 4th and 5th fingers and difficulty opening/closing hand completely.  Pt denies injury.  9/10 pain.  Pt reports little improvement with ice and ibuprofen.  +csm/+pulses.  Swelling noted to dorsum of hand.  Pt denies other complaints.  Skin PWD.  Speaking full/clear sentences, rr even/un-lab.  NAD.

## 2014-08-06 NOTE — ED Notes (Signed)
Ortho tech aware of need for splint 

## 2014-08-06 NOTE — ED Provider Notes (Signed)
CSN: [id number]     Arrival date & time 08/06/14  [id number] History   First MD Initiated Contact with Patient 08/06/14 [phone]     Chief Complaint  Patient presents with  . Arm Swelling    R hand x1 week     (Consider location/radiation/quality/duration/timing/severity/associated sxs/prior Treatment) The history is provided by the patient.  Logan Boyle is a 23 y.o. male history of previous metacarpal fractures status post open reduction and internal fixation, here presenting with right hand pain and swelling. He has been remodeling his house and has been lifting heavy items for the last week. Denies any injury or trauma to the hand. Over the last week he has has noted progressive swelling of the right hand. He had trouble lifting heavy items. Denies any numbness or fingers turning blue. Took Motrin with minimal relief.    Past Medical History  Diagnosis Date  . No pertinent past medical history   . Hand fracture, right 2013    x 2    Past Surgical History  Procedure Laterality Date  . Fasciotomy  02/16/2012    Procedure: FASCIOTOMY;  Surgeon: Tami RibasKevin R Kuzma, MD;  Location: [facility];  Service: Orthopedics;  Laterality: Right;  dorsal hand  . Closed reduction metacarpal with percutaneous pinning  08/30/2012    Procedure: CLOSED REDUCTION METACARPAL WITH PERCUTANEOUS PINNING;  Surgeon: Tami RibasKevin R Kuzma, MD;  Location: [facility];  Service: Orthopedics;  Laterality: Right;  . Open reduction internal fixation (orif) metacarpal Right 02/21/2013    Procedure: OPEN REDUCTION INTERNAL FIXATION RIGHT SMALL AND RING ) METACARPAL FRACTURE ;  Surgeon: Tami RibasKevin R Kuzma, MD;  Location: [facility];  Service: Orthopedics;  Laterality: Right;  . Incision and drainage Right 04/30/2013    Procedure: INCISION AND DRAINAGE, HARDWARE REMOVAL RIGHT HAND;  Surgeon: Tami RibasKevin R Kuzma, MD;  Location: [facility];  Service: Orthopedics;  Laterality: Right;   No family history on  file. History  Substance Use Topics  . Smoking status: Current Every Day Smoker -- 0.50 packs/day    Types: Cigarettes  . Smokeless tobacco: Not on file  . Alcohol Use: No    Review of Systems  Musculoskeletal:       R hand swelling   All other systems reviewed and are negative.     Allergies  Review of patient's allergies indicates no known allergies.  Home Medications   Prior to Admission medications   Medication Sig Start Date End Date Taking? Authorizing Provider  HYDROcodone-acetaminophen (NORCO/VICODIN) 5-325 MG per tablet Take 1-2 tablets by mouth every 6 (six) hours as needed for moderate pain. 02/01/14   Antony MaduraKelly Humes, PA-C   BP 124/72 mmHg  Pulse 75  Temp(Src) 98.4 F (36.9 C) (Oral)  Resp 20  SpO2 99% Physical Exam  Constitutional: He is oriented to person, place, and time.  Uncomfortable   HENT:  Head: Normocephalic.  Eyes: Pupils are equal, round, and reactive to light.  Neck: Normal range of motion.  Cardiovascular: Normal rate.   Pulmonary/Chest: Effort normal.  Abdominal: Soft.  Musculoskeletal:  R hand swollen on 3rd and 4th metacarpals. Good capillary refill, 2+ pulses. Dec hand grasp from pain and swelling.   Neurological: He is alert and oriented to person, place, and time.  Skin: Skin is warm and dry.  Psychiatric: He has a normal mood and affect. His behavior is normal. Judgment and thought content normal.  Nursing note and vitals reviewed.   ED Course  Procedures (  including critical care time) Labs Review Labs Reviewed - No data to display  Imaging Review Dg Hand Complete Right  08/06/2014   CLINICAL DATA:  Right hand swelling for 1 week.  02/01/2014.  EXAM: RIGHT HAND - COMPLETE 3+ VIEW  COMPARISON:  02/01/2014.  FINDINGS: Moderate dorsal soft tissue swelling identified. The patient is status post prior sideplate and screw fixation of the fourth and fifth metacarpal bones. The sideplate involving the fourth metacarpal bone appears  separated from the cortical surface distally. The second and third screws appear backed out as noted previously. There is lucency around the screws as seen on the previous exam. This lucency appears increased in the interval and is concerning for osteomyelitis.  IMPRESSION: 1. Dorsal soft tissue swelling and increased fourth metacarpal bone lucency associated with the sideplate and screw device. In the appropriate clinical setting findings are concerning for hardware loosening and osteomyelitis involving the fourth metacarpal bone. 2. Stable position of the fifth metacarpal bone and associated hardware.   Electronically Signed   By: Signa Kellaylor  Stroud M.D.   On: 08/06/2014 10:30     EKG Interpretation None      MDM   Final diagnoses:  None   Logan Boyle is a 23 y.o. male here with R hand pain and swelling. Will get R hand xray. I doubt compartment syndrome. No evidence of cellulitis or abscess.   11:17 AM Xray showed loosening screws vs osteo. No clinical signs of osteo. I called Dr. Melvyn Novasrtmann, who recommend outpatient f/u. Will place velcro splint for comfort. Told him to call Dr. Merrilee SeashoreKuzma's office for repeat xray and f/u.    Richardean Canalavid H Yao, MD 08/06/14 [phone]

## 2014-08-06 NOTE — Discharge Instructions (Signed)
Wear splint for comfort.   Take motrin for pain.   Take percocet for severe pain. Do NOT drive with it.   No heavy lifting.   Follow up with Dr. Merlyn LotKuzma in a week for repeat xray.   Return to ER if you have fever, severe pain, worse swelling,

## 2014-11-10 ENCOUNTER — Emergency Department (HOSPITAL_COMMUNITY)
Admission: EM | Admit: 2014-11-10 | Discharge: 2014-11-10 | Disposition: A | Discharge status: Home / Self Care | Payer: Medicare Other | Attending: Emergency Medicine | Admitting: Emergency Medicine

## 2014-11-10 ENCOUNTER — Encounter (HOSPITAL_COMMUNITY): Payer: Self-pay

## 2014-11-10 ENCOUNTER — Emergency Department (HOSPITAL_COMMUNITY): Payer: Medicare Other

## 2014-11-10 DIAGNOSIS — Y92410 Unspecified street and highway as the place of occurrence of the external cause: Secondary | ICD-10-CM | POA: Insufficient documentation

## 2014-11-10 DIAGNOSIS — Z79899 Other long term (current) drug therapy: Secondary | ICD-10-CM | POA: Diagnosis not present

## 2014-11-10 DIAGNOSIS — Z8781 Personal history of (healed) traumatic fracture: Secondary | ICD-10-CM | POA: Insufficient documentation

## 2014-11-10 DIAGNOSIS — Z720 Tobacco use: Secondary | ICD-10-CM | POA: Insufficient documentation

## 2014-11-10 DIAGNOSIS — Y998 Other external cause status: Secondary | ICD-10-CM | POA: Diagnosis not present

## 2014-11-10 DIAGNOSIS — S61412A Laceration without foreign body of left hand, initial encounter: Secondary | ICD-10-CM | POA: Insufficient documentation

## 2014-11-10 DIAGNOSIS — S0990XA Unspecified injury of head, initial encounter: Secondary | ICD-10-CM | POA: Insufficient documentation

## 2014-11-10 DIAGNOSIS — Y9389 Activity, other specified: Secondary | ICD-10-CM | POA: Diagnosis not present

## 2014-11-10 DIAGNOSIS — Z23 Encounter for immunization: Secondary | ICD-10-CM | POA: Insufficient documentation

## 2014-11-10 DIAGNOSIS — S6992XA Unspecified injury of left wrist, hand and finger(s), initial encounter: Secondary | ICD-10-CM | POA: Diagnosis present

## 2014-11-10 MED ORDER — TETANUS-DIPHTH-ACELL PERTUSSIS 5-2.5-18.5 LF-MCG/0.5 IM SUSP
0.5000 mL | Freq: Once | INTRAMUSCULAR | Status: AC
  Administered 2014-11-10: 0.5 mL via INTRAMUSCULAR
  Filled 2014-11-10: qty 0.5

## 2014-11-10 MED ORDER — BACITRACIN ZINC 500 UNIT/GM EX OINT
TOPICAL_OINTMENT | Freq: Two times a day (BID) | CUTANEOUS | Status: DC
  Administered 2014-11-10: 1 via TOPICAL
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
## 2015-06-22 ENCOUNTER — Encounter (HOSPITAL_COMMUNITY): Payer: Self-pay

## 2015-06-22 ENCOUNTER — Emergency Department (HOSPITAL_COMMUNITY): Payer: Medicare Other

## 2015-06-22 ENCOUNTER — Emergency Department (HOSPITAL_COMMUNITY)
Admission: EM | Admit: 2015-06-22 | Discharge: 2015-06-22 | Disposition: A | Discharge status: Home / Self Care | Payer: Medicare Other | Attending: Emergency Medicine | Admitting: Emergency Medicine

## 2015-06-22 DIAGNOSIS — Z720 Tobacco use: Secondary | ICD-10-CM | POA: Insufficient documentation

## 2015-06-22 DIAGNOSIS — M86641 Other chronic osteomyelitis, right hand: Secondary | ICD-10-CM | POA: Insufficient documentation

## 2015-06-22 DIAGNOSIS — L02511 Cutaneous abscess of right hand: Secondary | ICD-10-CM | POA: Diagnosis present

## 2015-06-22 DIAGNOSIS — M8660 Other chronic osteomyelitis, unspecified site: Secondary | ICD-10-CM

## 2015-06-22 MED ORDER — CEPHALEXIN 500 MG PO CAPS: 500.0000 mg | ORAL_CAPSULE | Freq: Three times a day (TID) | ORAL | Status: AC

## 2015-06-22 MED ORDER — SULFAMETHOXAZOLE-TRIMETHOPRIM 800-160 MG PO TABS
1.0000 | ORAL_TABLET | Freq: Once | ORAL | Status: AC
  Administered 2015-06-22: 1 via ORAL
  Filled 2015-06-22: qty 1

## 2015-06-22 MED ORDER — OXYCODONE-ACETAMINOPHEN 5-325 MG PO TABS
2.0000 | ORAL_TABLET | Freq: Once | ORAL | Status: AC
  Administered 2015-06-22: 2 via ORAL
  Filled 2015-06-22: qty 2

## 2015-06-22 MED ORDER — CEPHALEXIN 500 MG PO CAPS
500.0000 mg | ORAL_CAPSULE | Freq: Once | ORAL | Status: AC
  Administered 2015-06-22: 500 mg via ORAL
  Filled 2015-06-22: qty 1

## 2015-06-22 MED ORDER — SULFAMETHOXAZOLE-TRIMETHOPRIM 800-160 MG PO TABS: 1.0000 | ORAL_TABLET | Freq: Two times a day (BID) | ORAL | Status: AC

## 2015-06-22 MED ORDER — OXYCODONE-ACETAMINOPHEN 5-325 MG PO TABS: 1.0000 | ORAL_TABLET | Freq: Four times a day (QID) | ORAL | Status: DC | PRN
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
## 2015-06-22 NOTE — ED Provider Notes (Signed)
CSN: [id number]     Arrival date & time 06/22/15  1719 History   First MD Initiated Contact with Patient 06/22/15 1920     Chief Complaint  Patient presents with  . Abscess     (Consider location/radiation/quality/duration/timing/severity/associated sxs/prior Treatment) HPI   Had an ORIF of his right fourth and fifth metacarpals couple years ago or last 2 weeks she's had worsening swelling and drainage from there. Also with erythema and pain. No fevers, nausea, vomiting or other symptoms. Progressively got worse during this time.  Past Medical History  Diagnosis Date  . No pertinent past medical history   . Hand fracture, right 2013    x 2    Past Surgical History  Procedure Laterality Date  . Fasciotomy  02/16/2012    Procedure: FASCIOTOMY;  Surgeon: [name], MD;  Location: [facility];  Service: Orthopedics;  Laterality: Right;  dorsal hand  . Closed reduction metacarpal with percutaneous pinning  08/30/2012    Procedure: CLOSED REDUCTION METACARPAL WITH PERCUTANEOUS PINNING;  Surgeon: [name], MD;  Location: [facility];  Service: Orthopedics;  Laterality: Right;  . Open reduction internal fixation (orif) metacarpal Right 02/21/2013    Procedure: OPEN REDUCTION INTERNAL FIXATION RIGHT SMALL AND RING ) METACARPAL FRACTURE ;  Surgeon: [name], MD;  Location: [facility];  Service: Orthopedics;  Laterality: Right;  . Incision and drainage Right 04/30/2013    Procedure: INCISION AND DRAINAGE, HARDWARE REMOVAL RIGHT HAND;  Surgeon: [name], MD;  Location: [facility];  Service: Orthopedics;  Laterality: Right;   History reviewed. No pertinent family history. Social History  Substance Use Topics  . Smoking status: Current Every Day Smoker -- 0.50 packs/day    Types: Cigarettes  . Smokeless tobacco: None  . Alcohol Use: No    Review of Systems  Skin: Positive for rash and wound.  All other systems reviewed and are  negative.     Allergies  Review of patient's allergies indicates no known allergies.  Home Medications   Prior to Admission medications   Medication Sig Start Date End Date Taking? Authorizing Provider  cephALEXin (KEFLEX) 500 MG capsule Take 1 capsule (500 mg total) by mouth 3 (three) times daily. 06/22/15   Marily Memos, MD  HYDROcodone-acetaminophen (NORCO/VICODIN) 5-325 MG per tablet Take 1-2 tablets by mouth every 6 (six) hours as needed for moderate pain. 02/01/14   Antony Madura, PA-C  oxyCODONE-acetaminophen (PERCOCET) 5-325 MG per tablet Take 1 tablet by mouth every 6 (six) hours as needed. 06/22/15   Marily Memos, MD  sulfamethoxazole-trimethoprim (BACTRIM DS,SEPTRA DS) 800-160 MG per tablet Take 1 tablet by mouth 2 (two) times daily. 06/22/15   Barbara Cower Mesner, MD   BP 100/79 mmHg  Pulse 86  Temp(Src) 98.5 F (36.9 C) (Oral)  Resp 16  SpO2 100% Physical Exam  Constitutional: He is oriented to person, place, and time. He appears well-developed and well-nourished.  HENT:  Head: Normocephalic and atraumatic.  Eyes: Conjunctivae and EOM are normal.  Neck: Normal range of motion. Neck supple.  Cardiovascular: Normal rate and regular rhythm.   Pulmonary/Chest: Effort normal. No respiratory distress.  Abdominal: Soft. There is no tenderness.  Musculoskeletal: Normal range of motion. He exhibits no edema or tenderness.  Neurological: He is alert and oriented to person, place, and time.  Skin: Skin is warm and dry. Rash (with purulent drainage and swelling on dorsum over right hand) noted.  Nursing note and vitals reviewed.  ED Course  Procedures (including critical care time) Labs Review Labs Reviewed - No data to display  Imaging Review Dg Hand Complete Right  06/22/2015   CLINICAL DATA:  RIGHT hand pain and swelling for 2 weeks. History of injury with surgery to the RIGHT hand 2 years ago.  EXAM: RIGHT HAND - COMPLETE 3+ VIEW  COMPARISON:  08/06/2014.  FINDINGS: Marked  soft tissue swelling is present over the dorsal aspect of the fourth and fifth metacarpals with fixation hardware in the metacarpals. On the frontal projection, there is increased lucency around the fourth metacarpal malleable plate and screw fixation and allowing for projection, the screws appear to of chain positioned slightly. The findings suggest chronic osteomyelitis of the fourth metacarpal accounting for the increased lucency, soft tissue swelling and shifting hardware. The fifth metacarpal ORIF appears intact.  IMPRESSION: Constellation of findings consistent with osteomyelitis of the fourth metacarpal around the ORIF.   Electronically Signed   By: Andreas Newport M.D.   On: 06/22/2015 18:38   I have personally reviewed and evaluated these images and lab results as part of my medical decision-making.   EKG Interpretation None      MDM   Final diagnoses:  Chronic osteomyelitis   Likely chronic osteomyelitis with hardware infection and subsequent damage to plate and screw. He is afebrile with no systemic symptoms. I discussed it with Dr. Mina Marble who is on-call for hand surgery. Secondary to no systemic symptoms and the chronic nature of the wound they want to see him in the office tomorrow. Do not feel the patient needs to be admitted at this time I will start him on antibiotics and some pain medicine.  I have personally and contemperaneously reviewed labs and imaging and used in my decision making as above.   A medical screening exam was performed and I feel the patient has had an appropriate workup for their chief complaint at this time and likelihood of emergent condition existing is low. They have been counseled on decision, discharge, follow up and which symptoms necessitate immediate return to the emergency department. They or their family verbally stated understanding and agreement with plan and discharged in stable condition.      Marily Memos, MD 06/22/15 [phone]

## 2015-06-23 ENCOUNTER — Other Ambulatory Visit: Payer: Self-pay | Admitting: Orthopedic Surgery

## 2015-06-23 ENCOUNTER — Encounter (HOSPITAL_BASED_OUTPATIENT_CLINIC_OR_DEPARTMENT_OTHER): Payer: Self-pay | Admitting: *Deleted

## 2015-06-24 ENCOUNTER — Encounter (HOSPITAL_BASED_OUTPATIENT_CLINIC_OR_DEPARTMENT_OTHER): Payer: Self-pay | Admitting: Certified Registered"

## 2015-06-24 ENCOUNTER — Ambulatory Visit (HOSPITAL_BASED_OUTPATIENT_CLINIC_OR_DEPARTMENT_OTHER): Payer: Medicare Other | Admitting: Certified Registered"

## 2015-06-24 ENCOUNTER — Ambulatory Visit (HOSPITAL_BASED_OUTPATIENT_CLINIC_OR_DEPARTMENT_OTHER)
Admission: RE | Admit: 2015-06-24 | Discharge: 2015-06-24 | Disposition: A | Discharge status: Home / Self Care | Payer: Medicare Other | Source: Ambulatory Visit | Attending: Orthopedic Surgery | Admitting: Orthopedic Surgery

## 2015-06-24 ENCOUNTER — Encounter (HOSPITAL_BASED_OUTPATIENT_CLINIC_OR_DEPARTMENT_OTHER)
Admission: RE | Disposition: A | Discharge status: Home / Self Care | Payer: Self-pay | Source: Ambulatory Visit | Attending: Orthopedic Surgery

## 2015-06-24 DIAGNOSIS — F17210 Nicotine dependence, cigarettes, uncomplicated: Secondary | ICD-10-CM | POA: Diagnosis not present

## 2015-06-24 DIAGNOSIS — Y838 Other surgical procedures as the cause of abnormal reaction of the patient, or of later complication, without mention of misadventure at the time of the procedure: Secondary | ICD-10-CM | POA: Insufficient documentation

## 2015-06-24 DIAGNOSIS — T8469XA Infection and inflammatory reaction due to internal fixation device of other site, initial encounter: Secondary | ICD-10-CM | POA: Diagnosis present

## 2015-06-24 HISTORY — PX: INCISION AND DRAINAGE: SHX5863

## 2015-06-24 HISTORY — PX: HARDWARE REMOVAL: SHX979

## 2015-06-24 HISTORY — DX: Personal history of (healed) traumatic fracture: Z87.81

## 2015-06-24 SURGERY — INCISION AND DRAINAGE
Anesthesia: General | Site: Hand | Laterality: Right

## 2015-06-24 MED ORDER — FENTANYL CITRATE (PF) 100 MCG/2ML IJ SOLN
INTRAMUSCULAR | Status: AC
Start: 2015-06-24 — End: 2015-06-24
  Filled 2015-06-24: qty 2

## 2015-06-24 MED ORDER — BUPIVACAINE-EPINEPHRINE (PF) 0.25% -1:200000 IJ SOLN
INTRAMUSCULAR | Status: AC
  Filled 2015-06-24: qty 30

## 2015-06-24 MED ORDER — OXYCODONE HCL 5 MG PO TABS
5.0000 mg | ORAL_TABLET | Freq: Once | ORAL | Status: AC | PRN
  Administered 2015-06-24: 5 mg via ORAL

## 2015-06-24 MED ORDER — OXYCODONE HCL 5 MG PO TABS
ORAL_TABLET | ORAL | Status: AC
  Filled 2015-06-24: qty 1

## 2015-06-24 MED ORDER — CHLORHEXIDINE GLUCONATE 4 % EX LIQD: 60.0000 mL | Freq: Once | CUTANEOUS | Status: DC

## 2015-06-24 MED ORDER — BUPIVACAINE-EPINEPHRINE 0.25% -1:200000 IJ SOLN
INTRAMUSCULAR | Status: DC | PRN
  Administered 2015-06-24: 10 mL

## 2015-06-24 MED ORDER — FENTANYL CITRATE (PF) 100 MCG/2ML IJ SOLN
INTRAMUSCULAR | Status: AC
  Filled 2015-06-24: qty 4

## 2015-06-24 MED ORDER — ONDANSETRON HCL 4 MG/2ML IJ SOLN
INTRAMUSCULAR | Status: AC
  Filled 2015-06-24: qty 2

## 2015-06-24 MED ORDER — OXYCODONE HCL 5 MG/5ML PO SOLN: 5.0000 mg | Freq: Once | ORAL | Status: AC | PRN

## 2015-06-24 MED ORDER — DEXAMETHASONE SODIUM PHOSPHATE 10 MG/ML IJ SOLN
INTRAMUSCULAR | Status: DC | PRN
  Administered 2015-06-24: 10 mg via INTRAVENOUS

## 2015-06-24 MED ORDER — LIDOCAINE HCL (CARDIAC) 20 MG/ML IV SOLN
INTRAVENOUS | Status: AC
  Filled 2015-06-24: qty 5

## 2015-06-24 MED ORDER — FENTANYL CITRATE (PF) 100 MCG/2ML IJ SOLN
25.0000 ug | INTRAMUSCULAR | Status: DC | PRN
  Administered 2015-06-24: 50 ug via INTRAVENOUS

## 2015-06-24 MED ORDER — ONDANSETRON HCL 4 MG/2ML IJ SOLN: 4.0000 mg | Freq: Four times a day (QID) | INTRAMUSCULAR | Status: DC | PRN

## 2015-06-24 MED ORDER — MIDAZOLAM HCL 2 MG/2ML IJ SOLN
1.0000 mg | INTRAMUSCULAR | Status: DC | PRN
  Administered 2015-06-24: 2 mg via INTRAVENOUS

## 2015-06-24 MED ORDER — PROPOFOL 10 MG/ML IV BOLUS
INTRAVENOUS | Status: AC
  Filled 2015-06-24: qty 20

## 2015-06-24 MED ORDER — PROPOFOL 10 MG/ML IV BOLUS
INTRAVENOUS | Status: DC | PRN
  Administered 2015-06-24: 30 mg via INTRAVENOUS
  Administered 2015-06-24: 200 mg via INTRAVENOUS
  Administered 2015-06-24: 70 mg via INTRAVENOUS

## 2015-06-24 MED ORDER — OXYCODONE-ACETAMINOPHEN 5-325 MG PO TABS: 1.0000 | ORAL_TABLET | ORAL | Status: DC | PRN

## 2015-06-24 MED ORDER — LACTATED RINGERS IV SOLN
INTRAVENOUS | Status: DC
  Administered 2015-06-24: 09:00:00 via INTRAVENOUS

## 2015-06-24 MED ORDER — ONDANSETRON HCL 4 MG/2ML IJ SOLN
INTRAMUSCULAR | Status: DC | PRN
  Administered 2015-06-24: 4 mg via INTRAVENOUS

## 2015-06-24 MED ORDER — DEXAMETHASONE SODIUM PHOSPHATE 10 MG/ML IJ SOLN
INTRAMUSCULAR | Status: AC
  Filled 2015-06-24: qty 1

## 2015-06-24 MED ORDER — GLYCOPYRROLATE 0.2 MG/ML IJ SOLN: 0.2000 mg | Freq: Once | INTRAMUSCULAR | Status: DC | PRN

## 2015-06-24 MED ORDER — MIDAZOLAM HCL 2 MG/2ML IJ SOLN
INTRAMUSCULAR | Status: AC
  Filled 2015-06-24: qty 4

## 2015-06-24 MED ORDER — FENTANYL CITRATE (PF) 100 MCG/2ML IJ SOLN
50.0000 ug | INTRAMUSCULAR | Status: DC | PRN
  Administered 2015-06-24: 100 ug via INTRAVENOUS

## 2015-06-24 MED ORDER — LIDOCAINE HCL (CARDIAC) 20 MG/ML IV SOLN
INTRAVENOUS | Status: DC | PRN
  Administered 2015-06-24: 60 mg via INTRAVENOUS

## 2015-06-24 SURGICAL SUPPLY — 83 items
APL SKNCLS STERI-STRIP NONHPOA (GAUZE/BANDAGES/DRESSINGS)
BAG DECANTER FOR FLEXI CONT (MISCELLANEOUS) IMPLANT
BANDAGE ELASTIC 3 VELCRO ST LF (GAUZE/BANDAGES/DRESSINGS) ×3 IMPLANT
BANDAGE ELASTIC 4 VELCRO ST LF (GAUZE/BANDAGES/DRESSINGS) ×3 IMPLANT
BENZOIN TINCTURE PRP APPL 2/3 (GAUZE/BANDAGES/DRESSINGS) IMPLANT
BLADE MINI RND TIP GREEN BEAV (BLADE) IMPLANT
BLADE SURG 15 STRL LF DISP TIS (BLADE) ×1 IMPLANT
BLADE SURG 15 STRL SS (BLADE) ×3
BNDG CMPR 9X4 STRL LF SNTH (GAUZE/BANDAGES/DRESSINGS)
BNDG COHESIVE 1X5 TAN STRL LF (GAUZE/BANDAGES/DRESSINGS) IMPLANT
BNDG ESMARK 4X9 LF (GAUZE/BANDAGES/DRESSINGS) IMPLANT
BNDG GAUZE ELAST 4 BULKY (GAUZE/BANDAGES/DRESSINGS) ×3 IMPLANT
CLOSURE WOUND 1/2 X4 (GAUZE/BANDAGES/DRESSINGS)
CONT SPECI 4OZ STER CLIK (MISCELLANEOUS) ×3 IMPLANT
CORDS BIPOLAR (ELECTRODE) ×3 IMPLANT
COVER BACK TABLE 60X90IN (DRAPES) ×3 IMPLANT
CUFF TOURNIQUET SINGLE 18IN (TOURNIQUET CUFF) ×3 IMPLANT
DECANTER SPIKE VIAL GLASS SM (MISCELLANEOUS) IMPLANT
DRAPE EXTREMITY T 121X128X90 (DRAPE) ×3 IMPLANT
DRAPE OEC MINIVIEW 54X84 (DRAPES) IMPLANT
DRAPE SURG 17X23 STRL (DRAPES) ×3 IMPLANT
DURAPREP 26ML APPLICATOR (WOUND CARE) ×3 IMPLANT
GAUZE PACKING IODOFORM 1/4X15 (GAUZE/BANDAGES/DRESSINGS) ×3 IMPLANT
GAUZE SPONGE 4X4 12PLY STRL (GAUZE/BANDAGES/DRESSINGS) ×3 IMPLANT
GAUZE SPONGE 4X4 16PLY XRAY LF (GAUZE/BANDAGES/DRESSINGS) IMPLANT
GAUZE XEROFORM 1X8 LF (GAUZE/BANDAGES/DRESSINGS) ×3 IMPLANT
GLOVE BIO SURGEON STRL SZ 6.5 (GLOVE) ×4 IMPLANT
GLOVE BIO SURGEONS STRL SZ 6.5 (GLOVE) ×2
GLOVE BIOGEL PI IND STRL 7.0 (GLOVE) ×1 IMPLANT
GLOVE BIOGEL PI INDICATOR 7.0 (GLOVE) ×2
GLOVE SURG SYN 8.0 (GLOVE) ×6 IMPLANT
GOWN STRL REUS W/ TWL LRG LVL3 (GOWN DISPOSABLE) ×1 IMPLANT
GOWN STRL REUS W/ TWL XL LVL3 (GOWN DISPOSABLE) ×1 IMPLANT
GOWN STRL REUS W/TWL LRG LVL3 (GOWN DISPOSABLE) ×3
GOWN STRL REUS W/TWL XL LVL3 (GOWN DISPOSABLE) ×6 IMPLANT
HANDPIECE INTERPULSE COAX TIP (DISPOSABLE)
IV NS IRRIG 3000ML ARTHROMATIC (IV SOLUTION) IMPLANT
LOOP VESSEL MAXI BLUE (MISCELLANEOUS) ×3 IMPLANT
NEEDLE HYPO 25X1 1.5 SAFETY (NEEDLE) IMPLANT
NEEDLE PRECISIONGLIDE 27X1.5 (NEEDLE) IMPLANT
NS IRRIG 1000ML POUR BTL (IV SOLUTION) IMPLANT
PACK BASIN DAY SURGERY FS (CUSTOM PROCEDURE TRAY) ×3 IMPLANT
PAD CAST 3X4 CTTN HI CHSV (CAST SUPPLIES) ×1 IMPLANT
PAD CAST 4YDX4 CTTN HI CHSV (CAST SUPPLIES) ×1 IMPLANT
PADDING CAST ABS 3INX4YD NS (CAST SUPPLIES) ×2
PADDING CAST ABS 4INX4YD NS (CAST SUPPLIES) ×2
PADDING CAST ABS COTTON 3X4 (CAST SUPPLIES) ×1 IMPLANT
PADDING CAST ABS COTTON 4X4 ST (CAST SUPPLIES) ×1 IMPLANT
PADDING CAST COTTON 3X4 STRL (CAST SUPPLIES) ×3
PADDING CAST COTTON 4X4 STRL (CAST SUPPLIES) ×3
SET HNDPC FAN SPRY TIP SCT (DISPOSABLE) IMPLANT
SHEET MEDIUM DRAPE 40X70 STRL (DRAPES) ×3 IMPLANT
SPLINT PLASTER CAST XFAST 3X15 (CAST SUPPLIES) IMPLANT
SPLINT PLASTER CAST XFAST 4X15 (CAST SUPPLIES) ×5 IMPLANT
SPLINT PLASTER XTRA FAST SET 4 (CAST SUPPLIES) ×10
SPLINT PLASTER XTRA FASTSET 3X (CAST SUPPLIES)
STOCKINETTE 4X48 STRL (DRAPES) ×3 IMPLANT
STRIP CLOSURE SKIN 1/2X4 (GAUZE/BANDAGES/DRESSINGS) IMPLANT
SUT ETHILON 3 0 PS 1 (SUTURE) IMPLANT
SUT ETHILON 5 0 PS 2 18 (SUTURE) IMPLANT
SUT MON AB 4-0 PC3 18 (SUTURE) IMPLANT
SUT PROLENE 3 0 PS 2 (SUTURE) IMPLANT
SUT VIC AB 0 CT3 27 (SUTURE) IMPLANT
SUT VIC AB 0 SH 27 (SUTURE) IMPLANT
SUT VIC AB 2-0 SH 27 (SUTURE)
SUT VIC AB 2-0 SH 27XBRD (SUTURE) IMPLANT
SUT VIC AB 3-0 PS1 18 (SUTURE)
SUT VIC AB 3-0 PS1 18XBRD (SUTURE) IMPLANT
SUT VIC AB 4-0 P-3 18XBRD (SUTURE) IMPLANT
SUT VIC AB 4-0 P3 18 (SUTURE)
SUT VICRYL 4-0 PS2 18IN ABS (SUTURE) IMPLANT
SUT VICRYL RAPIDE 4-0 (SUTURE) IMPLANT
SUT VICRYL RAPIDE 4/0 PS 2 (SUTURE) IMPLANT
SWAB COLLECTION DEVICE MRSA (MISCELLANEOUS) IMPLANT
SYR BULB 3OZ (MISCELLANEOUS) ×3 IMPLANT
SYR CONTROL 10ML LL (SYRINGE) IMPLANT
SYRINGE 10CC LL (SYRINGE) ×3 IMPLANT
TOWEL OR 17X24 6PK STRL BLUE (TOWEL DISPOSABLE) ×3 IMPLANT
TUBE ANAEROBIC SPECIMEN COL (MISCELLANEOUS) ×3 IMPLANT
TUBE CONNECTING 20'X1/4 (TUBING)
TUBE CONNECTING 20X1/4 (TUBING) IMPLANT
UNDERPAD 30X30 (UNDERPADS AND DIAPERS) ×3 IMPLANT
YANKAUER SUCT BULB TIP NO VENT (SUCTIONS) IMPLANT

## 2015-06-24 NOTE — H&P (Signed)
Logan Boyle is an 24 y.o. male.   Chief Complaint: right hand pain and swelling with drainage HPI: as above s/p orif right ring and small metacarpals now with infection  Past Medical History  Diagnosis Date  . History of hand fracture 2013    right ring and small metacarpal fx.    Past Surgical History  Procedure Laterality Date  . Fasciotomy  02/16/2012    Procedure: FASCIOTOMY;  Surgeon: [name], MD;  Location: [facility];  Service: Orthopedics;  Laterality: Right;  dorsal hand  . Closed reduction metacarpal with percutaneous pinning  08/30/2012    Procedure: CLOSED REDUCTION METACARPAL WITH PERCUTANEOUS PINNING;  Surgeon: [name], MD;  Location: [facility];  Service: Orthopedics;  Laterality: Right;  . Open reduction internal fixation (orif) metacarpal Right 02/21/2013    Procedure: OPEN REDUCTION INTERNAL FIXATION RIGHT SMALL AND RING ) METACARPAL FRACTURE ;  Surgeon: [name], MD;  Location: [facility];  Service: Orthopedics;  Laterality: Right;  . Incision and drainage Right 04/30/2013    Procedure: INCISION AND DRAINAGE, HARDWARE REMOVAL RIGHT HAND;  Surgeon: [name], MD;  Location: [facility];  Service: Orthopedics;  Laterality: Right;    History reviewed. No pertinent family history. Social History:  reports that he has been smoking Cigarettes.  He has a 10 pack-year smoking history. He has never used smokeless tobacco. He reports that he does not drink alcohol or use illicit drugs.  Allergies: No Known Allergies  Medications Prior to Admission  Medication Sig Dispense Refill  . cephALEXin (KEFLEX) 500 MG capsule Take 1 capsule (500 mg total) by mouth 3 (three) times daily. 30 capsule 0  . sulfamethoxazole-trimethoprim (BACTRIM DS,SEPTRA DS) 800-160 MG per tablet Take 1 tablet by mouth 2 (two) times daily. 20 tablet 0  . oxyCODONE-acetaminophen (PERCOCET) 5-325 MG per tablet Take 1 tablet by mouth every 6 (six) hours  as needed. 20 tablet 0    No results found for this or any previous visit (from the past 48 hour(s)). Dg Hand Complete Right  06/22/2015   CLINICAL DATA:  RIGHT hand pain and swelling for 2 weeks. History of injury with surgery to the RIGHT hand 2 years ago.  EXAM: RIGHT HAND - COMPLETE 3+ VIEW  COMPARISON:  08/06/2014.  FINDINGS: Marked soft tissue swelling is present over the dorsal aspect of the fourth and fifth metacarpals with fixation hardware in the metacarpals. On the frontal projection, there is increased lucency around the fourth metacarpal malleable plate and screw fixation and allowing for projection, the screws appear to of chain positioned slightly. The findings suggest chronic osteomyelitis of the fourth metacarpal accounting for the increased lucency, soft tissue swelling and shifting hardware. The fifth metacarpal ORIF appears intact.  IMPRESSION: Constellation of findings consistent with osteomyelitis of the fourth metacarpal around the ORIF.   Electronically Signed   By: Andreas Newport M.D.   On: 06/22/2015 18:38    Review of Systems  All other systems reviewed and are negative.   Height  (1.727 m), weight 71.668 kg (158 lb). Physical Exam  Constitutional: He is oriented to person, place, and time. He appears well-developed and well-nourished.  HENT:  Head: Normocephalic and atraumatic.  Cardiovascular: Normal rate.   Respiratory: Effort normal.  Musculoskeletal:       Right hand: He exhibits tenderness and swelling.  Chronic osteo of right ring finger metacarpal  Neurological: He is alert and oriented to person,  place, and time.  Skin: Skin is warm.  Psychiatric: He has a normal mood and affect. His behavior is normal. Judgment and thought content normal.     Assessment/Plan As above  Plan I and D and hardware removal  WEINGOLD,MATTHEW A 06/24/2015, 8:21 AM

## 2015-06-24 NOTE — Anesthesia Preprocedure Evaluation (Signed)
Anesthesia Evaluation  Patient identified by MRN, date of birth, ID band Patient awake    Reviewed: Allergy & Precautions, NPO status , Patient's Chart, lab work & pertinent test results  Airway Mallampati: II   Neck ROM: full    Dental   Pulmonary Current Smoker,    breath sounds clear to auscultation       Cardiovascular negative cardio ROS   Rhythm:regular Rate:Normal     Neuro/Psych    GI/Hepatic   Endo/Other    Renal/GU      Musculoskeletal   Abdominal   Peds  Hematology   Anesthesia Other Findings   Reproductive/Obstetrics                             Anesthesia Physical Anesthesia Plan  ASA: I  Anesthesia Plan: General   Post-op Pain Management:    Induction: Intravenous  Airway Management Planned: LMA  Additional Equipment:   Intra-op Plan:   Post-operative Plan:   Informed Consent: I have reviewed the patients History and Physical, chart, labs and discussed the procedure including the risks, benefits and alternatives for the proposed anesthesia with the patient or authorized representative who has indicated his/her understanding and acceptance.     Plan Discussed with: CRNA, Anesthesiologist and Surgeon  Anesthesia Plan Comments:         Anesthesia Quick Evaluation

## 2015-06-24 NOTE — Discharge Instructions (Signed)

## 2015-06-24 NOTE — Transfer of Care (Signed)
Immediate Anesthesia Transfer of Care Note  Patient: Logan Boyle  Procedure(s) Performed: Procedure(s): INCISION AND DRAINAGE RIGHT SMALL AND RING METACARPAL FRACTURES (Right) HARDWARE REMOVAL (Right)  Patient Location: PACU  Anesthesia Type:General  Level of Consciousness: awake, alert  and oriented  Airway & Oxygen Therapy: Patient Spontanous Breathing and Patient connected to face mask oxygen  Post-op Assessment: Report given to RN, Post -op Vital signs reviewed and stable and Patient moving all extremities  Post vital signs: Reviewed and stable  Last Vitals:  Filed Vitals:   06/24/15 0839  BP: 132/67  Pulse: 72  Temp: 36.6 C  Resp: 18    Complications: No apparent anesthesia complications

## 2015-06-24 NOTE — Op Note (Signed)
See note [id number]

## 2015-06-24 NOTE — Anesthesia Postprocedure Evaluation (Signed)
Anesthesia Post Note  Patient: Logan Boyle  Procedure(s) Performed: Procedure(s) (LRB): INCISION AND DRAINAGE RIGHT SMALL AND RING METACARPAL FRACTURES (Right) HARDWARE REMOVAL (Right)  Anesthesia type: General  Patient location: PACU  Post pain: Pain level controlled and Adequate analgesia  Post assessment: Post-op Vital signs reviewed, Patient's Cardiovascular Status Stable, Respiratory Function Stable, Patent Airway and Pain level controlled  Last Vitals:  Filed Vitals:   06/24/15 1130  BP: 115/78  Pulse: 64  Temp:   Resp: 16    Post vital signs: Reviewed and stable  Level of consciousness: awake, alert  and oriented  Complications: No apparent anesthesia complications

## 2015-06-24 NOTE — Anesthesia Procedure Notes (Signed)
Procedure Name: LMA Insertion Date/Time: 06/24/2015 10:13 AM Performed by: Curly Shores Pre-anesthesia Checklist: Patient identified, Emergency Drugs available, Suction available and Patient being monitored Patient Re-evaluated:Patient Re-evaluated prior to inductionOxygen Delivery Method: Circle System Utilized Preoxygenation: Pre-oxygenation with 100% oxygen Intubation Type: IV induction Ventilation: Mask ventilation without difficulty LMA: LMA inserted LMA Size: 5.0 Number of attempts: 1 Airway Equipment and Method: Bite block Placement Confirmation: positive ETCO2 and breath sounds checked- equal and bilateral Tube secured with: Tape Dental Injury: Teeth and Oropharynx as per pre-operative assessment

## 2015-06-25 ENCOUNTER — Encounter (HOSPITAL_BASED_OUTPATIENT_CLINIC_OR_DEPARTMENT_OTHER): Payer: Self-pay | Admitting: Orthopedic Surgery

## 2015-06-25 NOTE — Op Note (Signed)
Logan Boyle, BALLWEG               ACCOUNT NO.:  [id number]  MEDICAL RECORD NO.:  [id number]  LOCATION:                               FACILITY:  [facility]  PHYSICIAN:  Artist Pais. Weingold, M.D.DATE OF BIRTH:  1991-04-17  DATE OF PROCEDURE:  06/24/2015 DATE OF DISCHARGE:  06/24/2015                              OPERATIVE REPORT   PREOPERATIVE DIAGNOSIS:  Osteomyelitis, right ring finger status post open reduction internal fixation with dorsal plate and screw.  POSTOPERATIVE DIAGNOSIS:  Osteomyelitis, right ring finger status post open reduction internal fixation with dorsal plate and screw.  PROCEDURE:  Hardware removal x2, ring and small metacarpal as well as incision and drainage of osteomyelitis with culture, soft tissue and bone, right hand.  SURGEON:  Artist Pais. Mina Marble, M.D.  ASSISTANT:  None.  ANESTHESIA:  General.  COMPLICATIONS:  No complications.  DRAINS:  No drains.  DESCRIPTION OF PROCEDURE:  The patient was taken to the operating suite. After induction of general anesthetic, right upper extremity was prepped and draped in sterile fashion.  An Esmarch was used to exsanguinate the limb.  Tourniquet was inflated to 250 mmHg.  At this point, I incised using an old incision in the dorsal aspect of the right hand into the ring and small metacarpal; skin was incised.  Dissection was carried down to the ring metacarpal.  There was a loose A.L.P.S. 1.5 mm plate, loosening from the bone.  This was carefully removed.  The bone was infected.  We removed the infected soft tissue and bone, both were sent for cultures.  We then opened up the periosteum of the small metacarpal, removed the 1.5 mm plate and screws in that area.  We then thoroughly irrigated with 1 L of normal saline.  We then packed this open with quarter-inch iodoform gauze into the osseous defect of the ring metacarpal and the soft tissues.  We loosely closed the skin with three 4-0 nylon sutures.  Xeroform,  4x4s, and compressive dressing, volar splint was applied.  The patient tolerated the procedure well in a concealed fashion.     Artist Pais Mina Marble, M.D.     MAW/MEDQ  D:  06/24/2015  T:  06/24/2015  Job:  [id number]

## 2015-06-28 LAB — TISSUE CULTURE
Culture: NO GROWTH
Culture: NO GROWTH
Gram Stain: NONE SEEN

## 2015-06-28 LAB — ANAEROBIC CULTURE

## 2015-06-29 ENCOUNTER — Encounter (HOSPITAL_BASED_OUTPATIENT_CLINIC_OR_DEPARTMENT_OTHER): Payer: Self-pay | Admitting: Orthopedic Surgery

## 2015-07-01 LAB — ANAEROBIC CULTURE

## 2015-07-24 LAB — FUNGUS CULTURE W SMEAR
Fungal Smear: NONE SEEN
Fungal Smear: NONE SEEN

## 2015-08-06 LAB — AFB CULTURE WITH SMEAR (NOT AT ARMC)
Acid Fast Smear: NONE SEEN
Acid Fast Smear: NONE SEEN

## 2015-10-25 ENCOUNTER — Encounter (HOSPITAL_COMMUNITY): Payer: Self-pay | Admitting: Family Medicine

## 2015-10-25 ENCOUNTER — Emergency Department (HOSPITAL_COMMUNITY)
Admission: EM | Admit: 2015-10-25 | Discharge: 2015-10-26 | Disposition: A | Discharge status: Home / Self Care | Payer: Medicare Other | Attending: Emergency Medicine | Admitting: Emergency Medicine

## 2015-10-25 DIAGNOSIS — IMO0002 Reserved for concepts with insufficient information to code with codable children: Secondary | ICD-10-CM

## 2015-10-25 DIAGNOSIS — Y9289 Other specified places as the place of occurrence of the external cause: Secondary | ICD-10-CM | POA: Insufficient documentation

## 2015-10-25 DIAGNOSIS — F17210 Nicotine dependence, cigarettes, uncomplicated: Secondary | ICD-10-CM | POA: Insufficient documentation

## 2015-10-25 DIAGNOSIS — S51812A Laceration without foreign body of left forearm, initial encounter: Secondary | ICD-10-CM | POA: Diagnosis present

## 2015-10-25 DIAGNOSIS — Y9389 Activity, other specified: Secondary | ICD-10-CM | POA: Diagnosis not present

## 2015-10-25 DIAGNOSIS — W208XXA Other cause of strike by thrown, projected or falling object, initial encounter: Secondary | ICD-10-CM | POA: Diagnosis not present

## 2015-10-25 DIAGNOSIS — Y998 Other external cause status: Secondary | ICD-10-CM | POA: Diagnosis not present

## 2015-10-25 NOTE — ED Notes (Signed)
Pt reports he had a piece of sheet metal cut his left forearm. Deep laceration with bleeding controlled with pressure.

## 2015-10-26 MED ORDER — TRAMADOL HCL 50 MG PO TABS: 50.0000 mg | ORAL_TABLET | Freq: Four times a day (QID) | ORAL | Status: AC | PRN

## 2015-10-26 MED ORDER — LIDOCAINE-EPINEPHRINE 2 %-1:100000 IJ SOLN
20.0000 mL | Freq: Once | INTRAMUSCULAR | Status: AC
  Administered 2015-10-26: 20 mL
  Filled 2015-10-26: qty 1

## 2015-10-26 MED ORDER — OXYCODONE-ACETAMINOPHEN 5-325 MG PO TABS
1.0000 | ORAL_TABLET | Freq: Once | ORAL | Status: AC
  Administered 2015-10-26: 1 via ORAL
  Filled 2015-10-26: qty 1

## 2015-10-26 NOTE — ED Notes (Signed)
NP at bedside for suture

## 2015-10-26 NOTE — ED Provider Notes (Signed)
CSN: [id number]     Arrival date & time 10/25/15  2336 History   First MD Initiated Contact with Patient 10/26/15 0048     Chief Complaint  Patient presents with  . Extremity Laceration     (Consider location/radiation/quality/duration/timing/severity/associated sxs/prior Treatment) HPI Comments: Patient is a 25 year old male who states he was reaching above his head in a shed to get a object off the shelf when a piece of sheet metal fell, striking him on the left forearm causing a 5 cm laceration   Past Medical History  Diagnosis Date  . History of hand fracture 2013    right ring and small metacarpal fx.   Past Surgical History  Procedure Laterality Date  . Fasciotomy  02/16/2012    Procedure: FASCIOTOMY;  Surgeon: [name], MD;  Location: [facility];  Service: Orthopedics;  Laterality: Right;  dorsal hand  . Closed reduction metacarpal with percutaneous pinning  08/30/2012    Procedure: CLOSED REDUCTION METACARPAL WITH PERCUTANEOUS PINNING;  Surgeon: [name], MD;  Location: [facility];  Service: Orthopedics;  Laterality: Right;  . Open reduction internal fixation (orif) metacarpal Right 02/21/2013    Procedure: OPEN REDUCTION INTERNAL FIXATION RIGHT SMALL AND RING ) METACARPAL FRACTURE ;  Surgeon: [name], MD;  Location: [facility];  Service: Orthopedics;  Laterality: Right;  . Incision and drainage Right 04/30/2013    Procedure: INCISION AND DRAINAGE, HARDWARE REMOVAL RIGHT HAND;  Surgeon: [name], MD;  Location: [facility];  Service: Orthopedics;  Laterality: Right;  . Incision and drainage Right 06/24/2015    Procedure: INCISION AND DRAINAGE RIGHT SMALL AND RING METACARPAL FRACTURES;  Surgeon: Dairl Ponder, MD;  Location: [facility];  Service: Orthopedics;  Laterality: Right;  . Hardware removal Right 06/24/2015    Procedure: HARDWARE REMOVAL;  Surgeon: Dairl Ponder, MD;  Location: [facility];  Service: Orthopedics;  Laterality: Right;   History reviewed. No pertinent family history. Social History  Substance Use Topics  . Smoking status: Current Every Day Smoker -- 1.00 packs/day for 10 years    Types: Cigarettes  . Smokeless tobacco: Never Used  . Alcohol Use: No    Review of Systems  Skin: Positive for wound.  Neurological: Negative for weakness and numbness.  All other systems reviewed and are negative.     Allergies  Review of patient's allergies indicates no known allergies.  Home Medications   Prior to Admission medications   Medication Sig Start Date End Date Taking? Authorizing Provider  cephALEXin (KEFLEX) 500 MG capsule Take 1 capsule (500 mg total) by mouth 3 (three) times daily. Patient not taking: Reported on 10/25/2015 06/22/15   Marily Memos, MD  sulfamethoxazole-trimethoprim (BACTRIM DS,SEPTRA DS) 800-160 MG per tablet Take 1 tablet by mouth 2 (two) times daily. Patient not taking: Reported on 10/25/2015 06/22/15   Marily Memos, MD  traMADol (ULTRAM) 50 MG tablet Take 1 tablet (50 mg total) by mouth every 6 (six) hours as needed. 10/26/15   Earley Favor, NP   BP 154/111 mmHg  Pulse 102  Temp(Src) 97.6 F (36.4 C) (Oral)  Resp 20  SpO2 99% Physical Exam  Constitutional: He is oriented to person, place, and time. He appears well-developed and well-nourished.  Eyes: Pupils are equal, round, and reactive to light.  Neck: Normal range of motion.  Cardiovascular: Normal rate and regular rhythm.   Pulmonary/Chest: Effort normal and breath sounds normal.  Abdominal: Soft.  Musculoskeletal: Normal range of motion. He exhibits tenderness.       Arms: Neurological: He is alert and oriented to person, place, and time.  Skin: Skin is warm and dry.  Nursing note and vitals reviewed.   ED Course  .Marland KitchenLaceration Repair Date/Time: 10/26/2015 1:37 AM Performed by: Earley Favor Authorized by: Earley Favor Consent: Verbal consent obtained. Written  consent not obtained. Risks and benefits: risks, benefits and alternatives were discussed Consent given by: patient Patient understanding: patient states understanding of the procedure being performed Patient identity confirmed: verbally with patient Time out: Immediately prior to procedure a "time out" was called to verify the correct patient, procedure, equipment, support staff and site/side marked as required. Body area: upper extremity Location details: left lower arm Laceration length: 5 cm Contamination: The wound is contaminated. Foreign bodies: metal Tendon involvement: none Nerve involvement: none Vascular damage: no Local anesthetic: lidocaine 2% with epinephrine Anesthetic total: 3 ml Patient sedated: no Preparation: Patient was prepped and draped in the usual sterile fashion. Irrigation solution: saline Amount of cleaning: standard Debridement: none Degree of undermining: none Skin closure: 4-0 Prolene Number of sutures: 9 Technique: simple Approximation: close Approximation difficulty: simple Patient tolerance: Patient tolerated the procedure well with no immediate complications   (including critical care time) Labs Review Labs Reviewed - No data to display  Imaging Review No results found. I have personally reviewed and evaluated these images and lab results as part of my medical decision-making.   EKG Interpretation None     Full ROM of fingers and wrist  MDM   Final diagnoses:  Laceration         Earley Favor, NP 10/26/15 0147  Earley Favor, NP 10/26/15 0147  April Palumbo, MD 10/26/15 1610

## 2015-10-26 NOTE — Discharge Instructions (Signed)
Sutures should be removed in 10-14 days keep the area covered until healed

## 2016-04-08 IMAGING — CR DG HAND COMPLETE 3+V*R*
3 series · 3 of 3 positions shown · non-contrast
Comparison: 08/06/2014.

CLINICAL DATA: RIGHT hand pain and swelling for 2 weeks. History of
injury with surgery to the RIGHT hand 2 years ago.

EXAM:
RIGHT HAND - COMPLETE 3+ VIEW

[x hand pa right]
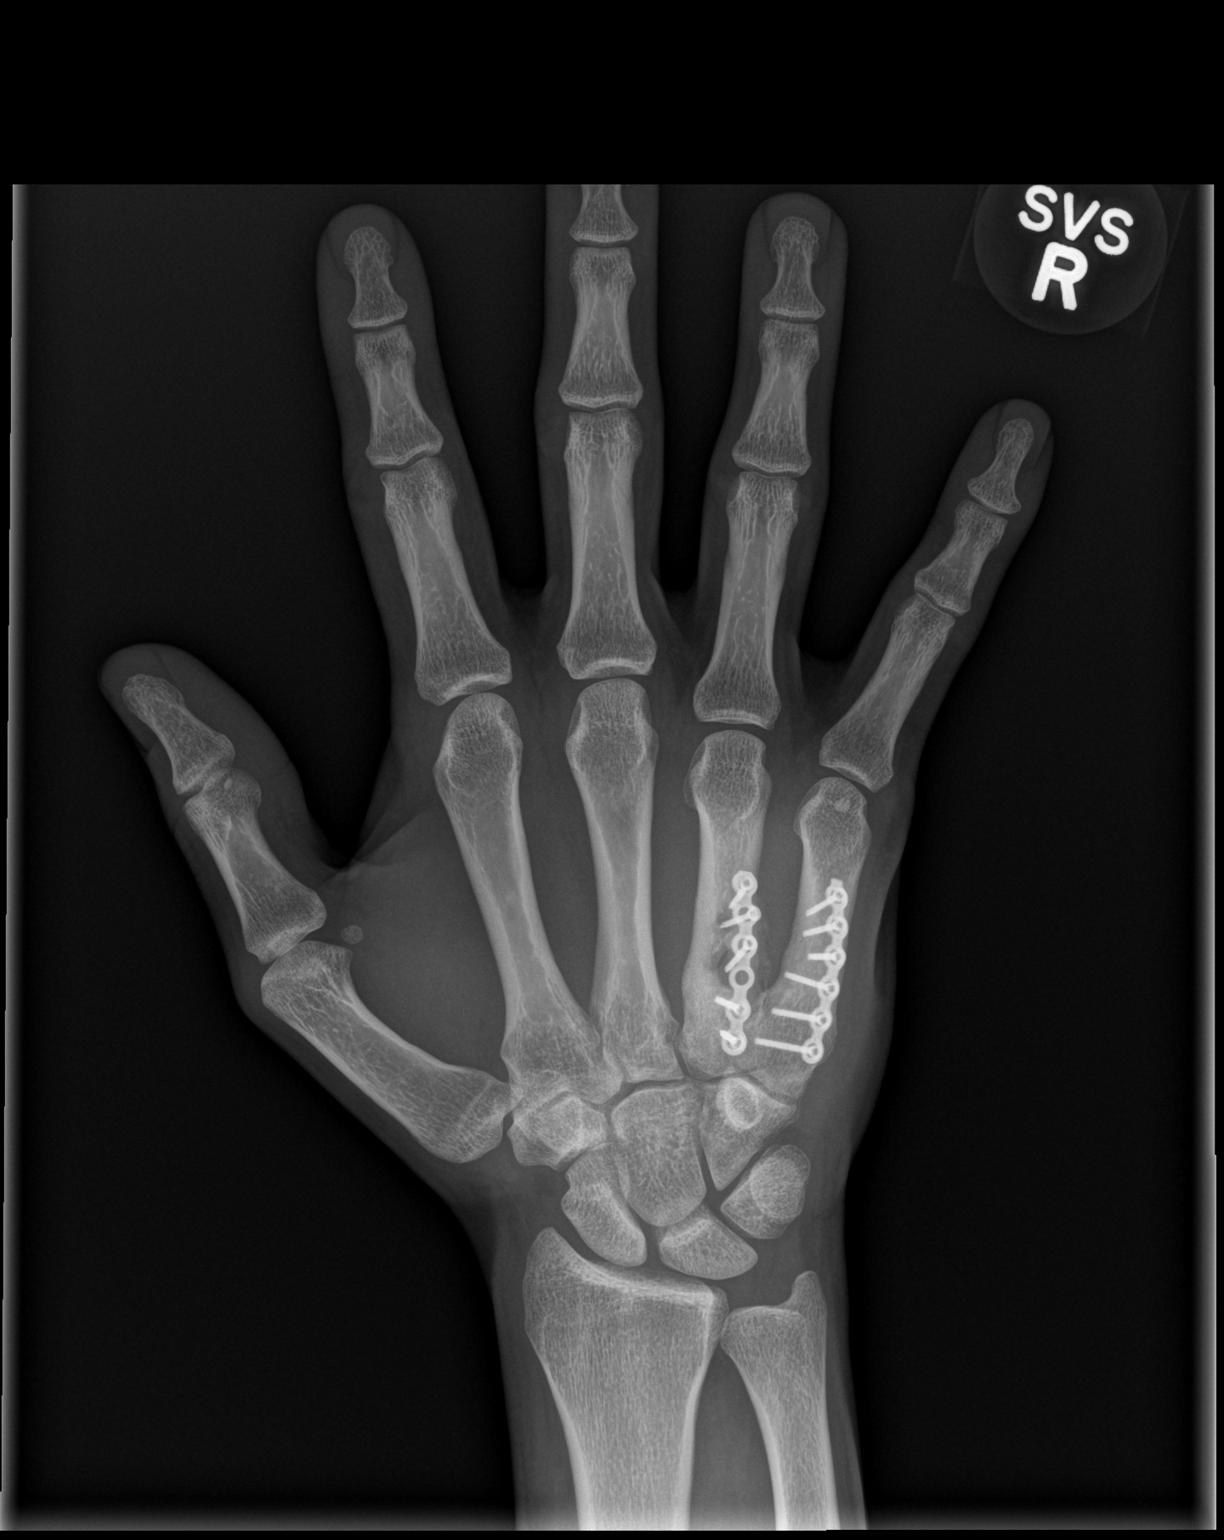

[x hand obl right]
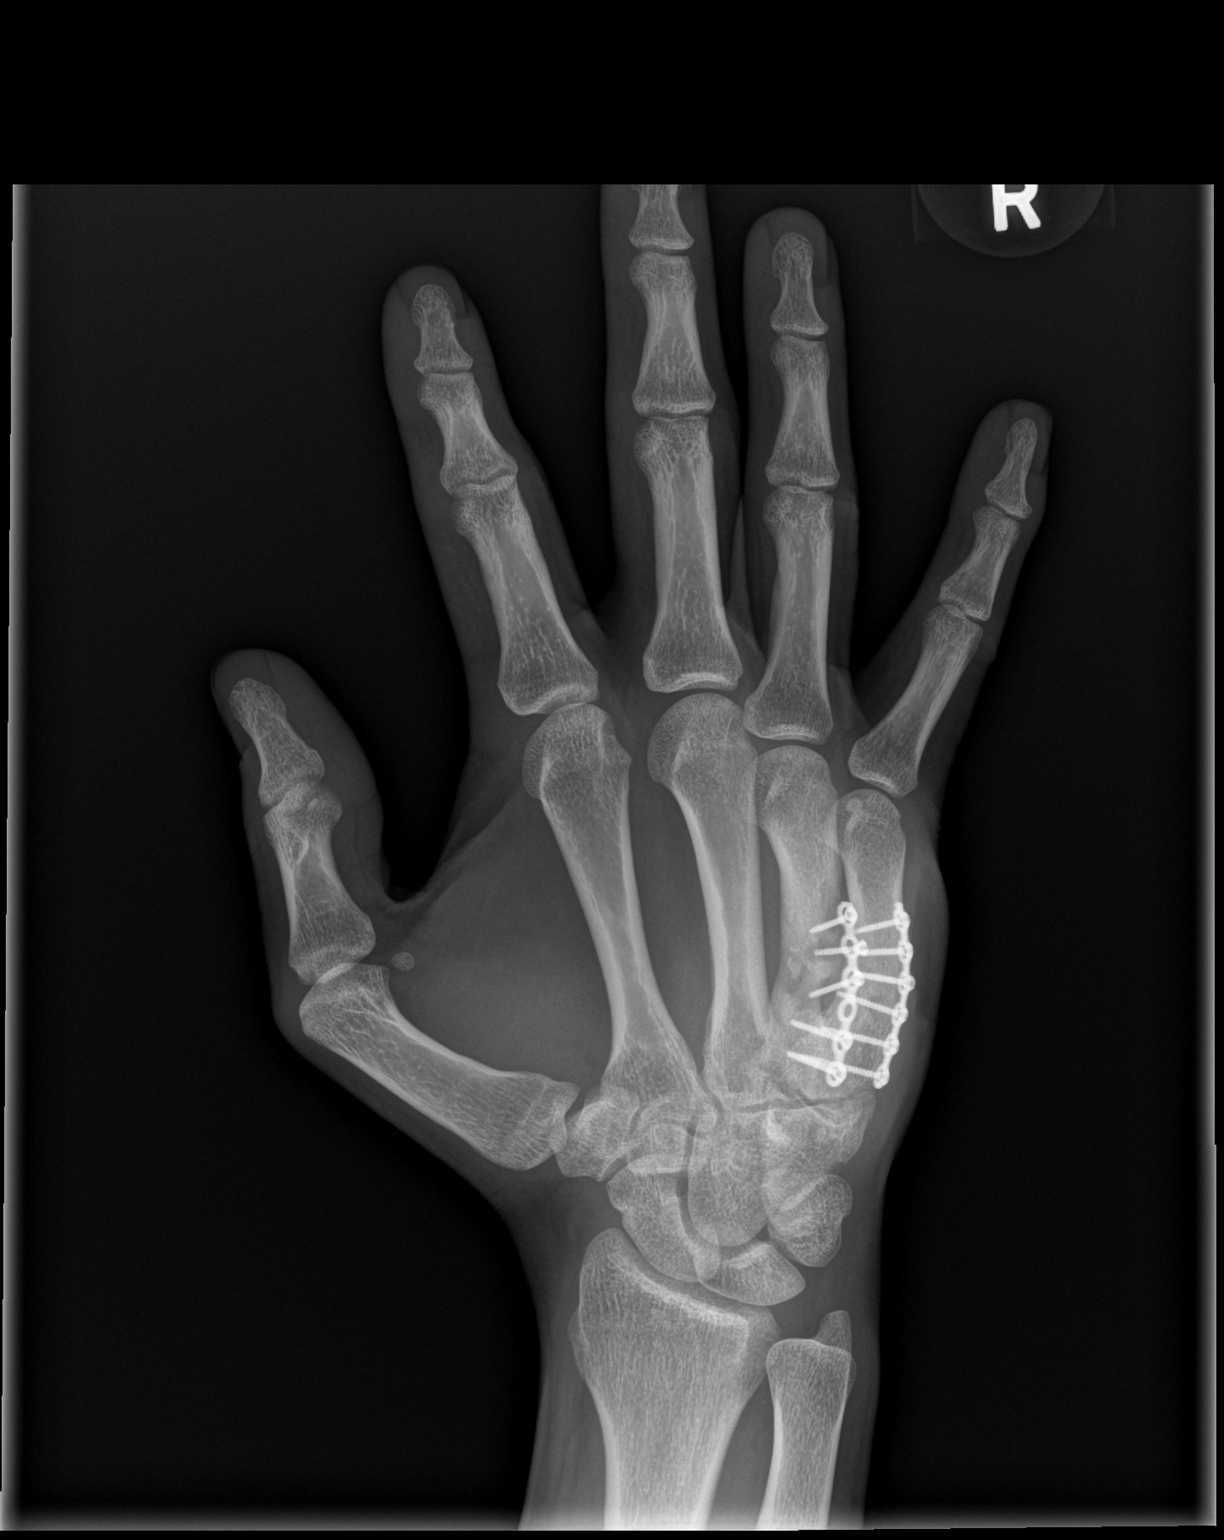

[x hand lat right]
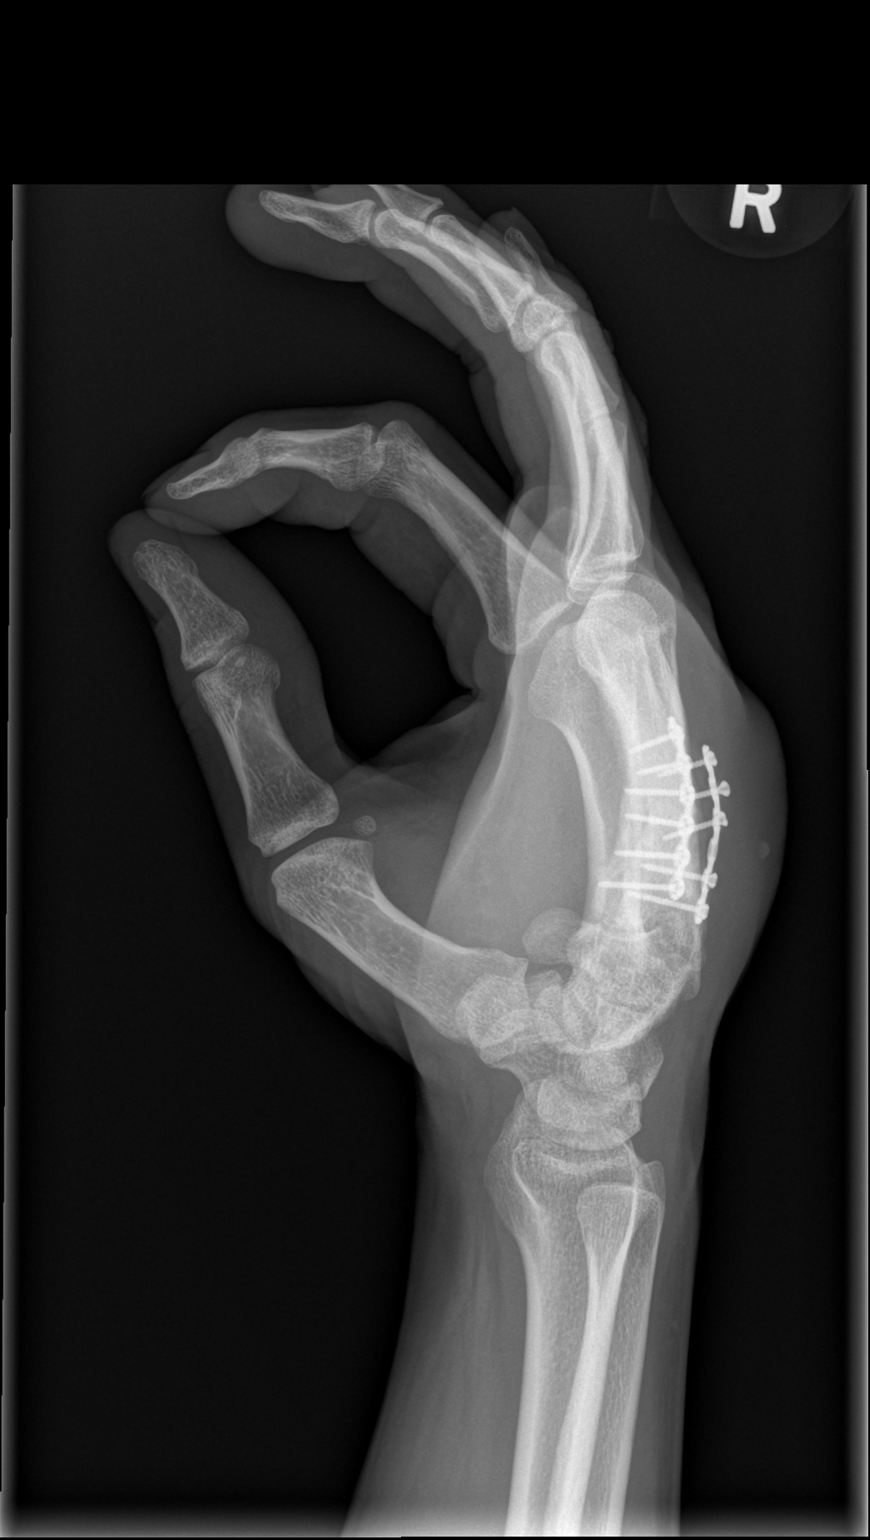

[3 of 3 positions shown; findings below may reference images not displayed]

FINDINGS: Marked soft tissue swelling is present over the dorsal aspect of the
fourth and fifth metacarpals with fixation hardware in the
metacarpals. On the frontal projection, there is increased lucency
around the fourth metacarpal malleable plate and screw fixation and
allowing for projection, the screws appear to of chain positioned
slightly. The findings suggest chronic osteomyelitis of the fourth
metacarpal accounting for the increased lucency, soft tissue
swelling and shifting hardware. The fifth metacarpal ORIF appears
intact.
IMPRESSION: Constellation of findings consistent with osteomyelitis of the
fourth metacarpal around the ORIF.
